# Patient Record
Sex: Female | Born: 1965 | Race: White | Hispanic: No | Marital: Married | Smoking: Former smoker
Health system: Southern US, Community
[De-identification: ages and names within clinical notes are randomized; demographics above are authoritative.]

## PROBLEM LIST (undated history)

## (undated) DIAGNOSIS — K8020 Calculus of gallbladder without cholecystitis without obstruction: Secondary | ICD-10-CM

## (undated) DIAGNOSIS — M8580 Other specified disorders of bone density and structure, unspecified site: Secondary | ICD-10-CM

## (undated) DIAGNOSIS — M810 Age-related osteoporosis without current pathological fracture: Secondary | ICD-10-CM

## (undated) DIAGNOSIS — F32A Depression, unspecified: Secondary | ICD-10-CM

## (undated) DIAGNOSIS — G5730 Lesion of lateral popliteal nerve, unspecified lower limb: Secondary | ICD-10-CM

## (undated) DIAGNOSIS — G8929 Other chronic pain: Secondary | ICD-10-CM

## (undated) DIAGNOSIS — F329 Major depressive disorder, single episode, unspecified: Secondary | ICD-10-CM

## (undated) DIAGNOSIS — N200 Calculus of kidney: Secondary | ICD-10-CM

## (undated) DIAGNOSIS — F419 Anxiety disorder, unspecified: Secondary | ICD-10-CM

## (undated) HISTORY — DX: Calculus of gallbladder without cholecystitis without obstruction: K80.20

## (undated) HISTORY — DX: Anxiety disorder, unspecified: F41.9

## (undated) HISTORY — DX: Other specified disorders of bone density and structure, unspecified site: M85.80

## (undated) HISTORY — DX: Other chronic pain: G89.29

## (undated) HISTORY — PX: CHOLECYSTECTOMY: SHX55

## (undated) HISTORY — PX: LITHOTRIPSY: SUR834

## (undated) HISTORY — DX: Age-related osteoporosis without current pathological fracture: M81.0

## (undated) HISTORY — DX: Lesion of lateral popliteal nerve, unspecified lower limb: G57.30

## (undated) HISTORY — DX: Calculus of kidney: N20.0

## (undated) HISTORY — DX: Depression, unspecified: F32.A

---
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
## 1997-07-23 ENCOUNTER — Ambulatory Visit (HOSPITAL_COMMUNITY): Admission: RE | Admit: 1997-07-23 | Discharge: 1997-07-23 | Payer: Self-pay | Admitting: Obstetrics and Gynecology

## 1997-10-29 ENCOUNTER — Other Ambulatory Visit: Admission: RE | Admit: 1997-10-29 | Discharge: 1997-10-29 | Payer: Self-pay | Admitting: Obstetrics and Gynecology

## 1997-12-01 ENCOUNTER — Encounter: Admission: RE | Admit: 1997-12-01 | Discharge: 1998-03-01 | Payer: Self-pay | Admitting: Obstetrics and Gynecology

## 1998-05-20 ENCOUNTER — Inpatient Hospital Stay (HOSPITAL_COMMUNITY): Admission: AD | Admit: 1998-05-20 | Discharge: 1998-05-20 | Payer: Self-pay | Admitting: Obstetrics and Gynecology

## 1998-05-21 ENCOUNTER — Inpatient Hospital Stay (HOSPITAL_COMMUNITY): Admission: AD | Admit: 1998-05-21 | Discharge: 1998-05-23 | Payer: Self-pay | Admitting: Obstetrics and Gynecology

## 1998-06-17 ENCOUNTER — Other Ambulatory Visit: Admission: RE | Admit: 1998-06-17 | Discharge: 1998-06-17 | Payer: Self-pay | Admitting: Obstetrics and Gynecology

## 1999-07-06 ENCOUNTER — Other Ambulatory Visit: Admission: RE | Admit: 1999-07-06 | Discharge: 1999-07-06 | Payer: Self-pay | Admitting: Obstetrics and Gynecology

## 1999-08-08 ENCOUNTER — Encounter: Payer: Self-pay | Admitting: Internal Medicine

## 1999-08-08 ENCOUNTER — Inpatient Hospital Stay (HOSPITAL_COMMUNITY): Admission: AD | Admit: 1999-08-08 | Discharge: 1999-08-15 | Payer: Self-pay | Admitting: Internal Medicine

## 1999-08-12 ENCOUNTER — Encounter: Payer: Self-pay | Admitting: Internal Medicine

## 1999-08-13 ENCOUNTER — Encounter: Payer: Self-pay | Admitting: Surgery

## 2000-09-10 ENCOUNTER — Other Ambulatory Visit: Admission: RE | Admit: 2000-09-10 | Discharge: 2000-09-10 | Payer: Self-pay | Admitting: Obstetrics and Gynecology

## 2001-09-18 ENCOUNTER — Other Ambulatory Visit: Admission: RE | Admit: 2001-09-18 | Discharge: 2001-09-18 | Payer: Self-pay | Admitting: Obstetrics and Gynecology

## 2002-09-23 ENCOUNTER — Other Ambulatory Visit: Admission: RE | Admit: 2002-09-23 | Discharge: 2002-09-23 | Payer: Self-pay | Admitting: Obstetrics and Gynecology

## 2004-02-23 ENCOUNTER — Other Ambulatory Visit: Admission: RE | Admit: 2004-02-23 | Discharge: 2004-02-23 | Payer: Self-pay | Admitting: Obstetrics and Gynecology

## 2010-11-11 ENCOUNTER — Emergency Department (HOSPITAL_COMMUNITY): Payer: No Typology Code available for payment source

## 2010-11-11 ENCOUNTER — Emergency Department (HOSPITAL_COMMUNITY)
Admission: EM | Admit: 2010-11-11 | Discharge: 2010-11-11 | Disposition: A | Discharge status: Home / Self Care | Payer: No Typology Code available for payment source | Attending: Emergency Medicine | Admitting: Emergency Medicine

## 2010-11-11 DIAGNOSIS — R42 Dizziness and giddiness: Secondary | ICD-10-CM | POA: Insufficient documentation

## 2010-11-11 DIAGNOSIS — Z79899 Other long term (current) drug therapy: Secondary | ICD-10-CM | POA: Insufficient documentation

## 2010-11-11 DIAGNOSIS — N201 Calculus of ureter: Secondary | ICD-10-CM | POA: Insufficient documentation

## 2010-11-11 DIAGNOSIS — N1330 Unspecified hydronephrosis: Secondary | ICD-10-CM | POA: Insufficient documentation

## 2010-11-11 LAB — CBC
Platelets: 216 10*3/uL (ref 150–400)
RBC: 4.46 MIL/uL (ref 3.87–5.11)
WBC: 20.2 10*3/uL — ABNORMAL HIGH (ref 4.0–10.5)

## 2010-11-11 LAB — URINALYSIS, ROUTINE W REFLEX MICROSCOPIC
Bilirubin Urine: NEGATIVE
Glucose, UA: NEGATIVE mg/dL
Leukocytes, UA: NEGATIVE
Nitrite: NEGATIVE
Protein, ur: NEGATIVE mg/dL
Specific Gravity, Urine: 1.024 (ref 1.005–1.030)
Urobilinogen, UA: 1 mg/dL (ref 0.0–1.0)
pH: 7.5 (ref 5.0–8.0)

## 2010-11-11 LAB — BASIC METABOLIC PANEL
CO2: 23 mEq/L (ref 19–32)
Chloride: 100 mEq/L (ref 96–112)
Sodium: 135 mEq/L (ref 135–145)

## 2010-11-11 LAB — DIFFERENTIAL
Basophils Relative: 0 % (ref 0–1)
Eosinophils Absolute: 0.1 10*3/uL (ref 0.0–0.7)
Lymphs Abs: 2.3 10*3/uL (ref 0.7–4.0)
Neutro Abs: 16.3 10*3/uL — ABNORMAL HIGH (ref 1.7–7.7)
Neutrophils Relative %: 81 % — ABNORMAL HIGH (ref 43–77)

## 2010-11-11 LAB — PREGNANCY, URINE: Preg Test, Ur: NEGATIVE

## 2010-11-11 LAB — URINE MICROSCOPIC-ADD ON

## 2010-11-13 LAB — URINE CULTURE
Colony Count: 25000
Culture  Setup Time: 201210280203

## 2010-12-13 ENCOUNTER — Other Ambulatory Visit: Payer: Self-pay | Admitting: Obstetrics and Gynecology

## 2010-12-13 DIAGNOSIS — N63 Unspecified lump in unspecified breast: Secondary | ICD-10-CM

## 2010-12-27 ENCOUNTER — Ambulatory Visit
Admission: RE | Admit: 2010-12-27 | Discharge: 2010-12-27 | Disposition: A | Discharge status: Home / Self Care | Payer: No Typology Code available for payment source | Source: Ambulatory Visit | Attending: Obstetrics and Gynecology | Admitting: Obstetrics and Gynecology

## 2010-12-27 DIAGNOSIS — N63 Unspecified lump in unspecified breast: Secondary | ICD-10-CM

## 2012-01-23 IMAGING — MG MM DIGITAL DIAGNOSTIC BILAT
5 series · 5 of 5 positions shown · non-contrast
Comparison: 12/01/2008 and 04/02/2007 mammograms

CLINICAL DATA: 45-year-old female with palpable thickening and
tenderness in the outer left breast.

DIGITAL DIAGNOSTIC BILATERAL MAMMOGRAM  with CAD AND LEFT BREAST
ULTRASOUND

[R CC]
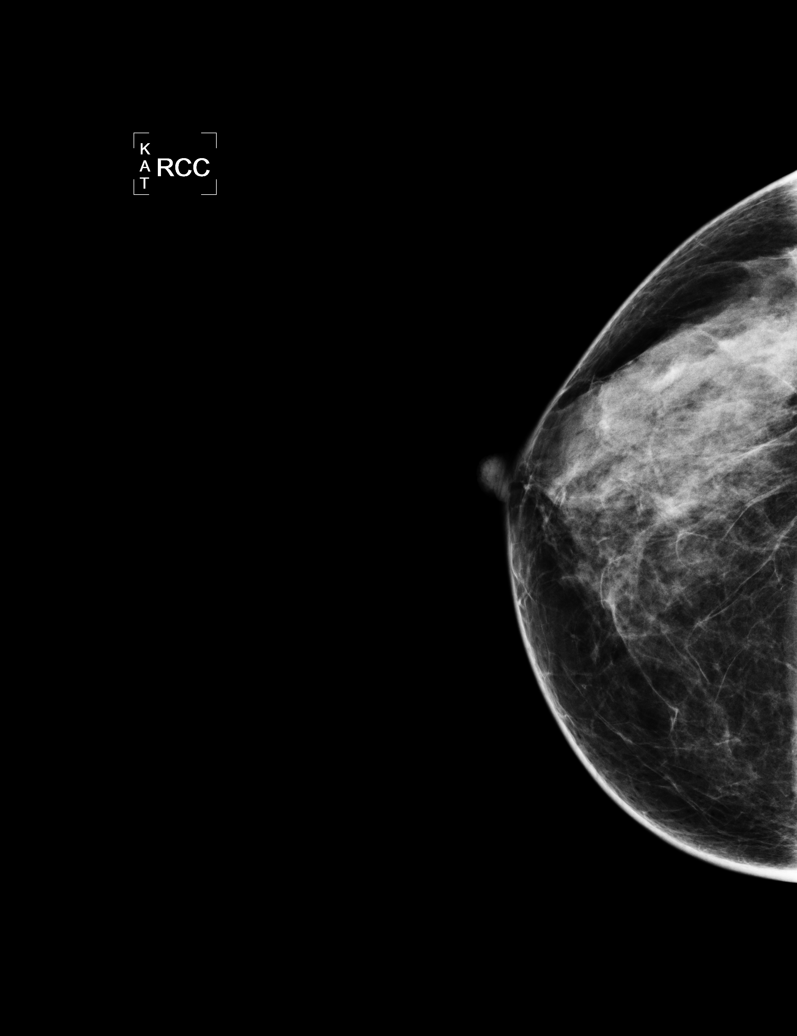

[L CC]
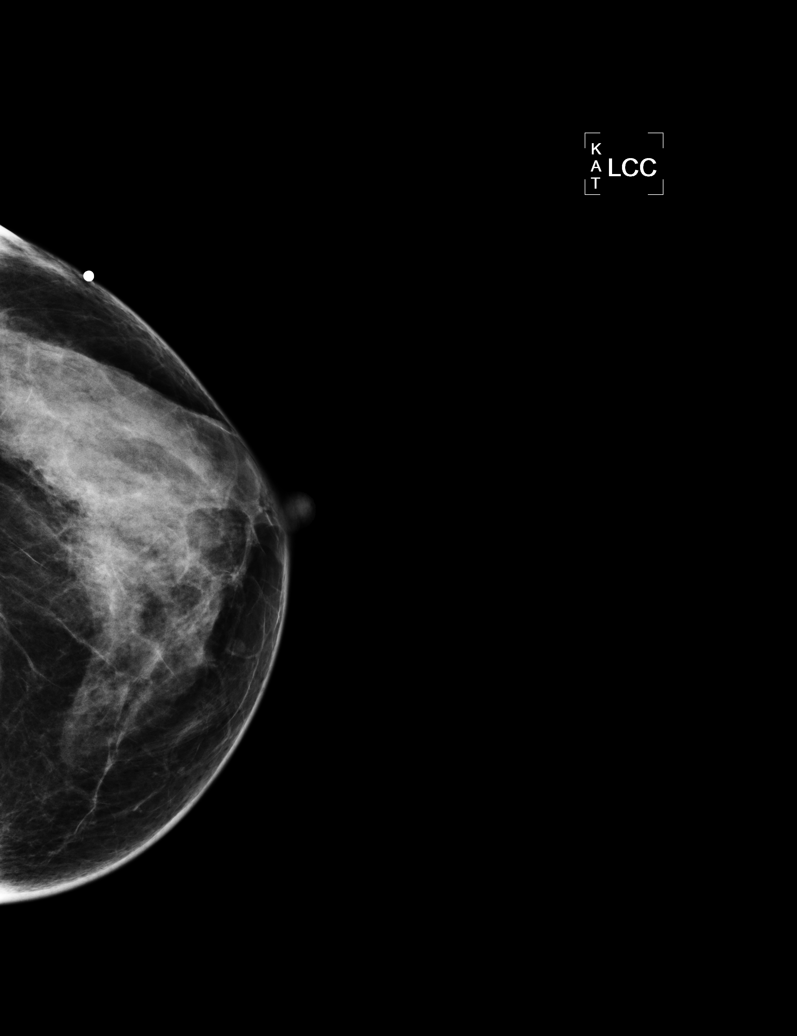

[L MLO]
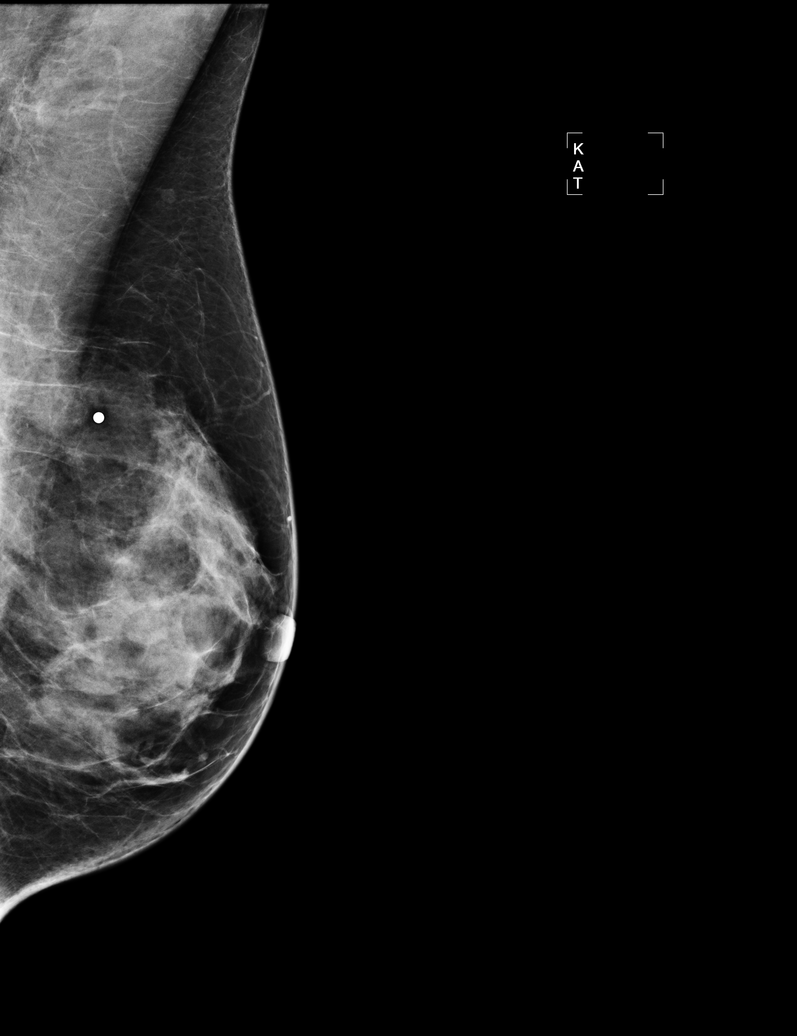

[R MLO]
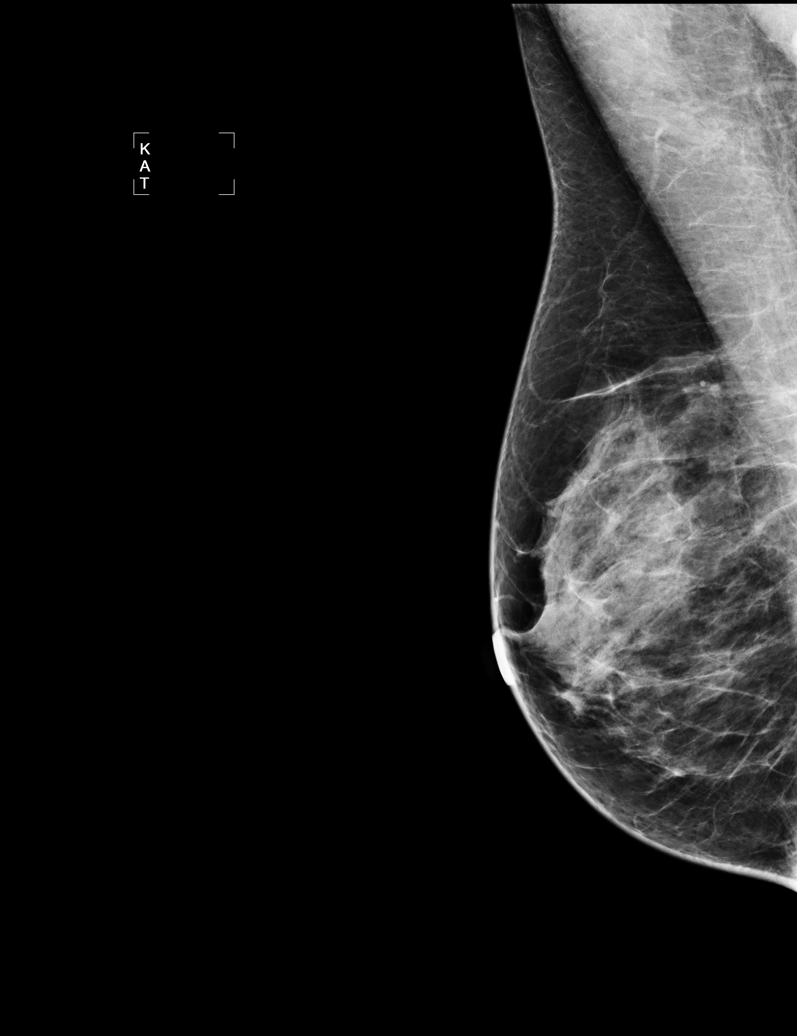

[L TAN]
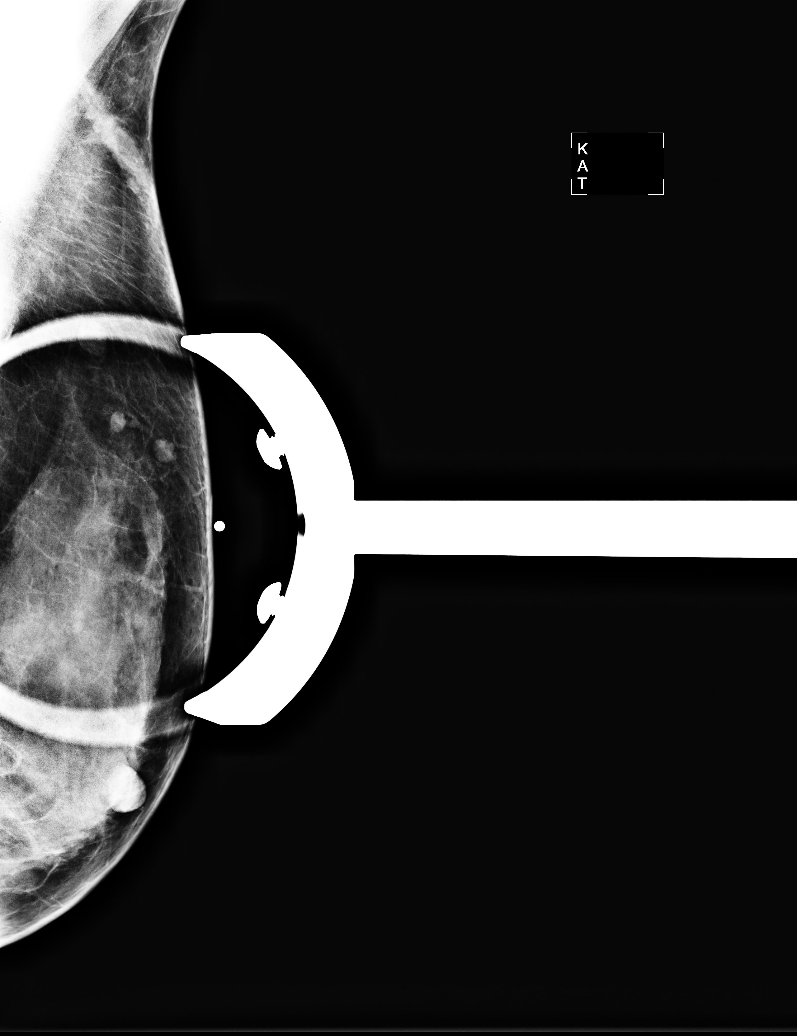

[5 of 5 positions shown; findings below may reference images not displayed]

FINDINGS: A spot compression view of the left breast and routine
views of bilateral breasts demonstrate no evidence of mass,
distortion, or suspicious calcifications.
Heterogeneously dense breast tissue bilaterally is again
identified.
Mammographic images were processed with CAD.

On physical exam, no palpable abnormalities are identified in the
outer left breast.

Targeted ultrasound of the outer left breast demonstrates no
evidence of solid or cystic mass, distortion or abnormal areas of
shadowing.
IMPRESSION: No mammographic, palpable or sonographic abnormalities in the outer
left breast in the area of palpable concern.

No specific mammographic evidence of breast malignancy.

These findings were discussed with the patient.  She was encouraged
to begin/continue monthly self exams and to contact her primary
physician if any changes noted.

BI-RADS CATEGORY 1:  Negative.

Recommend bilateral screening mammograms in 1 year.

## 2013-01-15 HISTORY — PX: SHOULDER ARTHROSCOPY: SHX128

## 2013-06-30 ENCOUNTER — Other Ambulatory Visit: Payer: Self-pay | Admitting: Obstetrics and Gynecology

## 2013-06-30 DIAGNOSIS — R922 Inconclusive mammogram: Secondary | ICD-10-CM

## 2013-07-08 ENCOUNTER — Ambulatory Visit
Admission: RE | Admit: 2013-07-08 | Discharge: 2013-07-08 | Disposition: A | Discharge status: Home / Self Care | Payer: BC Managed Care – PPO | Source: Ambulatory Visit | Attending: Obstetrics and Gynecology | Admitting: Obstetrics and Gynecology

## 2013-07-08 DIAGNOSIS — R922 Inconclusive mammogram: Secondary | ICD-10-CM

## 2013-07-08 DIAGNOSIS — R9230 Dense breasts, unspecified: Secondary | ICD-10-CM

## 2013-07-08 MED ORDER — GADOBENATE DIMEGLUMINE 529 MG/ML IV SOLN
13.0000 mL | Freq: Once | INTRAVENOUS | Status: AC | PRN
  Administered 2013-07-08: 13 mL via INTRAVENOUS

## 2013-07-09 ENCOUNTER — Other Ambulatory Visit: Payer: Self-pay | Admitting: Obstetrics and Gynecology

## 2013-07-09 DIAGNOSIS — R928 Other abnormal and inconclusive findings on diagnostic imaging of breast: Secondary | ICD-10-CM

## 2013-07-22 ENCOUNTER — Ambulatory Visit
Admission: RE | Admit: 2013-07-22 | Discharge: 2013-07-22 | Disposition: A | Discharge status: Home / Self Care | Payer: BC Managed Care – PPO | Source: Ambulatory Visit | Attending: Obstetrics and Gynecology | Admitting: Obstetrics and Gynecology

## 2013-07-22 ENCOUNTER — Ambulatory Visit: Payer: BC Managed Care – PPO

## 2013-07-22 ENCOUNTER — Other Ambulatory Visit: Payer: Self-pay | Admitting: Obstetrics and Gynecology

## 2013-07-22 DIAGNOSIS — R928 Other abnormal and inconclusive findings on diagnostic imaging of breast: Secondary | ICD-10-CM

## 2013-07-29 ENCOUNTER — Ambulatory Visit
Admission: RE | Admit: 2013-07-29 | Discharge: 2013-07-29 | Disposition: A | Discharge status: Home / Self Care | Payer: BC Managed Care – PPO | Source: Ambulatory Visit | Attending: Obstetrics and Gynecology | Admitting: Obstetrics and Gynecology

## 2013-07-29 DIAGNOSIS — R928 Other abnormal and inconclusive findings on diagnostic imaging of breast: Secondary | ICD-10-CM

## 2013-07-29 MED ORDER — GADOBENATE DIMEGLUMINE 529 MG/ML IV SOLN
13.0000 mL | Freq: Once | INTRAVENOUS | Status: AC | PRN
  Administered 2013-07-29: 13 mL via INTRAVENOUS

## 2014-01-12 ENCOUNTER — Other Ambulatory Visit: Payer: Self-pay | Admitting: Obstetrics and Gynecology

## 2014-01-12 DIAGNOSIS — R922 Inconclusive mammogram: Secondary | ICD-10-CM

## 2014-01-21 ENCOUNTER — Other Ambulatory Visit: Payer: BC Managed Care – PPO

## 2014-03-10 ENCOUNTER — Other Ambulatory Visit (HOSPITAL_COMMUNITY): Payer: Self-pay | Admitting: Obstetrics and Gynecology

## 2014-03-10 DIAGNOSIS — Z09 Encounter for follow-up examination after completed treatment for conditions other than malignant neoplasm: Secondary | ICD-10-CM

## 2014-03-11 ENCOUNTER — Other Ambulatory Visit (HOSPITAL_COMMUNITY): Payer: Self-pay | Admitting: Obstetrics and Gynecology

## 2014-03-11 ENCOUNTER — Other Ambulatory Visit: Payer: Self-pay

## 2014-03-11 DIAGNOSIS — R922 Inconclusive mammogram: Secondary | ICD-10-CM

## 2014-03-24 ENCOUNTER — Ambulatory Visit (HOSPITAL_COMMUNITY): Payer: Self-pay

## 2014-03-25 ENCOUNTER — Other Ambulatory Visit: Payer: Self-pay | Admitting: Obstetrics and Gynecology

## 2014-03-26 ENCOUNTER — Other Ambulatory Visit: Payer: Self-pay | Admitting: Obstetrics and Gynecology

## 2014-03-26 DIAGNOSIS — N6320 Unspecified lump in the left breast, unspecified quadrant: Secondary | ICD-10-CM

## 2014-03-26 LAB — CYTOLOGY - PAP

## 2015-11-04 DIAGNOSIS — R3129 Other microscopic hematuria: Secondary | ICD-10-CM | POA: Insufficient documentation

## 2016-11-07 ENCOUNTER — Encounter: Payer: Self-pay | Admitting: Internal Medicine

## 2017-10-16 DIAGNOSIS — M545 Low back pain, unspecified: Secondary | ICD-10-CM | POA: Insufficient documentation

## 2017-11-26 DIAGNOSIS — M549 Dorsalgia, unspecified: Secondary | ICD-10-CM | POA: Insufficient documentation

## 2017-11-26 DIAGNOSIS — G8929 Other chronic pain: Secondary | ICD-10-CM | POA: Insufficient documentation

## 2018-02-06 ENCOUNTER — Ambulatory Visit: Payer: Self-pay | Admitting: Neurology

## 2018-11-15 ENCOUNTER — Ambulatory Visit (HOSPITAL_COMMUNITY)
Admission: EM | Admit: 2018-11-15 | Discharge: 2018-11-15 | Disposition: A | Discharge status: Home / Self Care | Payer: No Typology Code available for payment source

## 2018-11-15 NOTE — ED Triage Notes (Signed)
Pt states she is having back pain on and off x 20 years. Pt states at the beginning of October she started having numbness in her legs. Pt states in the last week she stopped feeling any sensation from the knee down.  Pt states she had MRI 10/23/2017 Pt has an appointment with Neurologist on Nov 26, 2018

## 2018-11-26 ENCOUNTER — Other Ambulatory Visit: Payer: Self-pay

## 2018-11-26 ENCOUNTER — Ambulatory Visit (INDEPENDENT_AMBULATORY_CARE_PROVIDER_SITE_OTHER): Payer: Self-pay | Admitting: Neurology

## 2018-11-26 ENCOUNTER — Encounter: Payer: Self-pay | Admitting: Neurology

## 2018-11-26 VITALS — BP 107/74 | HR 78 | Temp 97.7°F | Ht 66.0 in | Wt 152.0 lb

## 2018-11-26 DIAGNOSIS — M6281 Muscle weakness (generalized): Secondary | ICD-10-CM

## 2018-11-26 DIAGNOSIS — R2990 Unspecified symptoms and signs involving the nervous system: Secondary | ICD-10-CM

## 2018-11-26 DIAGNOSIS — R202 Paresthesia of skin: Secondary | ICD-10-CM

## 2018-11-26 DIAGNOSIS — R531 Weakness: Secondary | ICD-10-CM

## 2018-11-26 DIAGNOSIS — R200 Anesthesia of skin: Secondary | ICD-10-CM

## 2018-11-26 DIAGNOSIS — R29898 Other symptoms and signs involving the musculoskeletal system: Secondary | ICD-10-CM

## 2018-11-26 DIAGNOSIS — R292 Abnormal reflex: Secondary | ICD-10-CM

## 2018-11-26 NOTE — Patient Instructions (Signed)
MRI of the brain and cervical spine Blood work (will ask Dr. Philip Aspen for his labs and review prior) Emg/ncs   Electromyoneurogram Electromyoneurogram is a test to check how well your muscles and nerves are working. This procedure includes the combined use of electromyogram (EMG) and nerve conduction study (NCS). EMG is used to look for muscular disorders. NCS, which is also called electroneurogram, measures how well your nerves are controlling your muscles. The procedures are usually done together to check if your muscles and nerves are healthy. If the results of the tests are abnormal, this may indicate disease or injury, such as a neuromuscular disease or peripheral nerve damage. Tell a health care provider about:  Any allergies you have.  All medicines you are taking, including vitamins, herbs, eye drops, creams, and over-the-counter medicines.  Any problems you or family members have had with anesthetic medicines.  Any blood disorders you have.  Any surgeries you have had.  Any medical conditions you have.  If you have a pacemaker.  Whether you are pregnant or may be pregnant. What are the risks? Generally, this is a safe procedure. However, problems may occur, including:  Infection where the electrodes were inserted.  Bleeding. What happens before the procedure? Medicines Ask your health care provider about:  Changing or stopping your regular medicines. This is especially important if you are taking diabetes medicines or blood thinners.  Taking medicines such as aspirin and ibuprofen. These medicines can thin your blood. Do not take these medicines unless your health care provider tells you to take them.  Taking over-the-counter medicines, vitamins, herbs, and supplements. General instructions  Your health care provider may ask you to avoid: ? Beverages that have caffeine, such as coffee and tea. ? Any products that contain nicotine or tobacco. These products include  cigarettes, e-cigarettes, and chewing tobacco. If you need help quitting, ask your health care provider.  Do not use lotions or creams on the same day that you will be having the procedure. What happens during the procedure? For EMG   Your health care provider will ask you to stay in a position so that he or she can access the muscle that will be studied. You may be standing, sitting, or lying down.  You may be given a medicine that numbs the area (local anesthetic).  A very thin needle that has an electrode will be inserted into your muscle.  Another small electrode will be placed on your skin near the muscle.  Your health care provider will ask you to continue to remain still.  The electrodes will send a signal that tells about the electrical activity of your muscles. You may see this on a monitor or hear it in the room.  After your muscles have been studied at rest, your health care provider will ask you to contract or flex your muscles. The electrodes will send a signal that tells about the electrical activity of your muscles.  Your health care provider will remove the electrodes and the electrode needles when the procedure is finished. The procedure may vary among health care providers and hospitals. For NCS   An electrode that records your nerve activity (recording electrode) will be placed on your skin by the muscle that is being studied.  An electrode that is used as a reference (reference electrode) will be placed near the recording electrode.  A paste or gel will be applied to your skin between the recording electrode and the reference electrode.  Your nerve will  be stimulated with a mild shock. Your health care provider will measure how much time it takes for your muscle to react.  Your health care provider will remove the electrodes and the gel when the procedure is finished. The procedure may vary among health care providers and hospitals. What happens after the  procedure?  It is up to you to get the results of your procedure. Ask your health care provider, or the department that is doing the procedure, when your results will be ready.  Your health care provider may: ? Give you medicines for any pain. ? Monitor the insertion sites to make sure that bleeding stops. Summary  Electromyoneurogram is a test to check how well your muscles and nerves are working.  If the results of the tests are abnormal, this may indicate disease or injury.  This is a safe procedure. However, problems may occur, such as bleeding and infection.  Your health care provider will do two tests to complete this procedure. One checks your muscles (EMG) and another checks your nerves (NCS).  It is up to you to get the results of your procedure. Ask your health care provider, or the department that is doing the procedure, when your results will be ready. This information is not intended to replace advice given to you by your health care provider. Make sure you discuss any questions you have with your health care provider. Document Released: 05/04/2004 Document Revised: 09/17/2017 Document Reviewed: 08/30/2017 Elsevier Patient Education  2020 [employer].

## 2018-11-26 NOTE — Progress Notes (Signed)
[facility]    Provider:  Dr Jaynee Eagles Requesting Provider: Suella Broad, MD Primary Care Provider:  Leanna Battles, MD  CC:  Leg numbness  HPI:  Sheena Gray is a 53 y.o. female here as requested by Suella Broad, MD for bilateral leg numbness. PMHx chronic low back pain, kidney stones.  I reviewed Dr. Herma Mering notes: Patient is on amitriptyline, methocarbamol, prednisone and sertraline, she has pain in the lumbar spine, chronic back pain, never smoker.  Patient was seen for chronic low back pain, I reviewed procedure note which shows that a spinal needle was placed in the L4-L5 interspace in the midline using fluoroscopic imaging, the epidural space was localized, 1 mL of Omnipaque was injected, there was good epidural flow, injected 1 mL of dexamethasone, patient tolerated the procedure very well.  The last labs I can find are from 2017, creatinine was 0.9 at that time.  I do not have her primary care records since she was referred from Dr. Nelva Bush.Will request. She saw Dr. [name] in 1998. She says she is in so much pain she cannot function.   Symptoms ongoing since 1998. Started in the toes and now progressive. She has chronic low back pain. She has herniated disks and disk degeneration, Dr. Rolena Infante does not think she is surgical, she sees Dr. Nelva Bush for a pain block. There is pain in the low back and into the buttocks. New symptoms includes sensory changes up to the thighs. Up above her knees and the pain is in the front of the shins, muscles in her calfs are in spasm, tight sore muscles, numb and tingly up the front of the leg like shint splints and the gastroc muscle is tight within the last month. Also pain in the inner thigh on the left leg tingly. She also reports numbness through both hands and her entire mouth went numb. She was told she may have MS. She is a Scientist, forensic. She is on her feet and it is intense. She is in tears every night due to the pain. Lower  back pain doesn't go away. Constant, continuous. She has gone to PT and seen multiple doctors and neurology.   Reviewed notes, labs and imaging from outside physicians, which showed:  I reviewed imaging that patient brought on CD is for me from [facility] orthopedics: MRI of the lumbar spine completed in April of 2015 showed some small left foraminal disc protrusions at L3-L4 and L4-L5 which contact the corresponding dorsal root ganglia (L3 and L4) but no neural displacement or encroachment, L5-S1 mild bilateral facet arthrosis no neural displacement or encroachment.  This was basically stable from an exam they had done the prior month.  Study in April 15 was stable from the previous study in March 2015.  MRI dated October 23, 2017 showed very similar findings to prior exams, L3 and L4 nerve roots minimally contact by very small foraminal protrusions at L3-L4 and L4-L5, slight disc bulge and mild facet degenerative joint disease at L5-S1 without stenosis or nerve impingement.  I also reviewed other notes that patient brought me an extensive list and I have documented only the relevant portions: Patient has been on methocarbamol in the past, methylprednisone Dosepak, sertraline, it appears she is also had pain blocks with Dr. Herma Mering Jun 08, 2018.  She has had methylprednisolone injections and Toradol at [facility] September 2019.  She is seen Melina Schools, I notes he had date on this office note however she reported having pain  level 7 out of 10, complaining of pain tingling and numbness midline and left lower back, left buttocks and down the leg to the toes as well as the right foot, physical therapy including outpatient physical therapy did not help, patient taking Aleve Advil and methocarbamol, Dr. Melina Schools did not feel that there was anything from a surgical standpoint that he could offer her for her lumbar spine and there is no objective clinical findings or evidence of structural abnormality, and  she was transferred care to Dr. Herma Mering for back pain management.  It appears that patient was seen in November 2019 by Dr. Herma Mering as well as Dr. Melina Schools.  I reviewed Dr. Herma Mering note history of present illness from November 28, 2017, midline low back pain, left buttocks pain, radiating down to the leg to the foot with numbness and tingling, also right lower leg from the knee down going on for approximately 2 months, back in situ since 1998, she is tried acupuncture, chiropractic care, injections in May 2015 with Dr. Herma Mering which she said caused more pain.  She had PT.  Robaxin and sertraline.  Saw Dr. Rolena Infante and neurosurgery recommended.  Dr. Dossie Der had done an L4 selective nerve root block and it actually just made her worse, fortunately she did get better with time, but unfortunately she is having recurrent symptoms, pain in her back radiating into the left lower limb for about 2 months, now even feeling for some foot numbness and tingling on the right radiating proximally up toward her right knee, not doing any better whatsoever.  He thought that the MRI in October 2019 actually looks a little better compared to the MRI done in 2015.  She had a reaction to gabapentin, she was sent to neurology, amitriptyline tried in the past, Dr. Rolena Infante not recommending surgery, they did discuss the left SI joint injection.  She cannot take gabapentin due to her reaction in the past, also prior notes from [place] regarding her shoulder status post arthroscopy April 13, 2013.  I see notes also from 2014 similar left shoulder pain at that time she was on tramadol, Robaxin, Zoloft.  And around that time also Modic.  Patient also provided me with notes from [facility] back in February 1999 by Dr. Stevie Kern for chronic low back pain, shoulder and hip pain quite incapacitating, evaluated by Dr. Sharol Given normal x-rays were told she had chronic low back pain, started on Zoloft, reported no relief,  bone scan was entirely normal of the lumbosacral spine and pelvis, a great deal of stress, at the time noted she was quite overweight had lost 35 to 40 pounds very normal stature presently, poor posture, no focal neurologic deficits, diagnosed with musculoskeletal pain no evidence for myelopathy or radiculopathy.  She was referred to [facility] for acupuncture and holistic rehabilitative approach to her problem.  Unfortunately after seeing Dr. Gust Rung she developed numbness and tingling down her left leg, at that time after he saw her she developed paralysis of her left leg, she was transferred to [facility] emergency room and had an evaluation there by a neurologist which included MRI which was normal, her exam was normal, and the paralysis resolved, but she was still reporting persistent numbness in her left leg, back pain, numbness going from her low back to her lateral thigh all the way to her foot, feeling of her leg being asleep, at times it feels so numb it has been as if she is gotten a  shot of Novocain, she also reported MRI as above and a bone scan and a trial of antidepressants without any relief, physical therapy at the time suggested piriformis syndrome and with a course of physical therapy, water exercise and stretching the physical therapist suggested her pain was somewhat better although the numbness and tingling still persist.  She provided me notes that go back to September 1999 where she reported back pain, she just found out she was pregnant, back stiffness first thing in the morning, improving with activity, feels the physical therapy is helping, nearly constant tingling sensation in her left buttocks and thigh and the whole area, feels numb, extensive evaluation in the past including an MRI which was unremarkable.  She is also seen Dr. [name] in the past who felt that there was some functional overlay and without any objective abnormalities.  Patient also saw Dr. Morrell Riddle who referred her to Dr.  Sigurd Sos for acupuncture.  Normal c-Met, TSH, CBC and sed rate in the office.  I reviewed further notes going back even further without any significantly new findings. .  Review of Systems: Patient complains of symptoms per HPI as well as the following symptoms: pain, low back pain, numbness in jaw and both hands Pertinent negatives and positives per HPI. All others negative.   Social History   Socioeconomic History   Marital status: Married    Spouse name: Not on file   Number of children: 2   Years of education: post-graduate   Highest education level: Not on file  Occupational History   Not on file  Social Needs   Financial resource strain: Not on file   Food insecurity    Worry: Not on file    Inability: Not on file   Transportation needs    Medical: Not on file    Non-medical: Not on file  Tobacco Use   Smoking status: Never Smoker  Substance and Sexual Activity   Alcohol use: Not on file    Comment: no   Drug use: Not on file    Comment: no   Sexual activity: Not on file  Lifestyle   Physical activity    Days per week: Not on file    Minutes per session: Not on file   Stress: Not on file  Relationships   Social connections    Talks on phone: Not on file    Gets together: Not on file    Attends religious service: Not on file    Active member of club or organization: Not on file    Attends meetings of clubs or organizations: Not on file    Relationship status: Not on file   Intimate partner violence    Fear of current or ex partner: Not on file    Emotionally abused: Not on file    Physically abused: Not on file    Forced sexual activity: Not on file  Other Topics Concern   Not on file  Social History Narrative   Lives at home with spouse    Family History  Problem Relation Age of Onset   Breast cancer Mother    Osteoporosis Mother    Asthma Mother    Heart disease Father    Heart attack Father    Diabetes Other     Past  Medical History:  Diagnosis Date   Kidney stones     Patient Active Problem List   Diagnosis Date Noted   Multiple neurological symptoms 12/01/2018    Past  Surgical History:  Procedure Laterality Date   CHOLECYSTECTOMY     SHOULDER ARTHROSCOPY  2015    Current Outpatient Medications  Medication Sig Dispense Refill   amitriptyline (ELAVIL) 10 MG tablet Take 10 mg by mouth at bedtime.     Melatonin 5 MG TABS Take by mouth at bedtime as needed.     methocarbamol (ROBAXIN) 500 MG tablet Take 500 mg by mouth at bedtime as needed.      Multiple Vitamins-Minerals (CENTRUM SILVER 50+WOMEN PO) Take 1 tablet by mouth daily.     OVER THE COUNTER MEDICATION Alive Calcium bone formula     sertraline (ZOLOFT) 100 MG tablet Take 100 mg by mouth daily.     ALPRAZolam (XANAX) 0.5 MG tablet Take 0.5 mg by mouth as needed.     No current facility-administered medications for this visit.     Allergies as of 11/26/2018 - Review Complete 11/26/2018  Allergen Reaction Noted   Shellfish allergy Nausea And Vomiting 11/26/2018    Vitals: BP 107/74 (BP Location: Right Arm, Patient Position: Sitting)    Pulse 78    Temp 97.7 F (36.5 C) Comment: taken at front door   Ht 5' 6" (1.676 m)    Wt 152 lb (68.9 kg)    BMI 24.53 kg/m  Last Weight:  Wt Readings from Last 1 Encounters:  11/26/18 152 lb (68.9 kg)   Last Height:   Ht Readings from Last 1 Encounters:  11/26/18 5' 6" (1.676 m)     Physical exam: Exam: Gen: NAD, conversant, well nourised, well groomed                     CV: RRR, no MRG. No Carotid Bruits. No peripheral edema, warm, nontender Eyes: Conjunctivae clear without exudates or hemorrhage  Neuro: Detailed Neurologic Exam  Speech:    Speech is normal; fluent and spontaneous with normal comprehension.  Cognition:    The patient is oriented to person, place, and time;     recent and remote memory intact;     language fluent;     normal attention, concentration,       fund of knowledge Cranial Nerves:    The pupils are equal, round, and reactive to light. The fundi are flat. Visual fields are full to finger confrontation. Extraocular movements are intact. Trigeminal sensation is intact and the muscles of mastication are normal. Right lid retracted slightly. face is symmetric. The palate elevates in the midline. Hearing intact. Voice is normal. Shoulder shrug is normal. The tongue has normal motion without fasciculations.   Coordination:    Normal finger to nose and heel to shin. Normal rapid alternating movements.   Gait:    Heel-toe and tandem gait are normal.   Motor Observation:    No asymmetry, no atrophy, and no involuntary movements noted. Tone:    Normal muscle tone.    Posture:    Posture is normal. normal erect    Strength: Left hip flexion weakness 4+/5.  Otherwise strength is V/V in the upper and lower limbs.      Sensation: intact to LT, intact pin prick distally in the feet ad  leg possibly some dysesthesias laterally at knees, intact vibration     Reflex Exam:  DTR's: was able to elicit good AJ reflexes with dendritic maneuver however right AJ slighlty diminished. Otherwise brisk refexes.    Toes:    The toes are downgoing bilaterally.   Clonus:    Clonus is  absent.    Assessment/Plan: This is a very nice 53 year old female with a past medical history of chronic back pain, radicular symptoms, paresthesias, weakness in the lower extremities that started in 1998 without etiology found, she has been to multiple physicians, surgeons, physical therapists, had acupuncture and chiropractic, been to orthopedists and pain specialist and had multiple interventions and medications without significant relief.  She also reports numbness in her face, and both hands, sensory changes up to above the knees, sore muscles and spasms, numbness both hands and in the face specifically the whole mouth.  Despite all her efforts no etiology has been  found.  She seen neurology in the past who suggested there may be some functional overlay.  As far as I can see, patient has never been evaluated for disorder such as multiple sclerosis that could cause multiple neurologic symptoms I do recommend in addition to all her extensive work-ups in the past that she have an MRI of the brain and the cervical spine for any central causes such as multiple sclerosis.  I also recommend an EMG nerve conduction study to evaluate for any peripheral nerve disorders or muscle diseases.  -MRI of the brain/cervical spine due to numbness in the lower extremities, hands and face need to evaluate for multiple sclerosis given multiple unexplained neurologic symptoms - emg/ncs of upper extremities and one leg - Dr. Parke Simmers (need to get a copy of the labs and his records): I made a request from Dr. Sharlett Iles for his notes which included negative binding, blocking and modulating antibodies for myasthenia gravis, normal CRP, normal CK at 168, normal magnesium. - self pay, we discussed [facility] Financial Assistance and gave information  Orders Placed This Encounter  Procedures   MR BRAIN W WO CONTRAST   MR CERVICAL SPINE W WO CONTRAST   NCV with EMG(electromyography)     Cc: Suella Broad, MD,  Leanna Battles, MD  Sarina Ill, MD  [facility] [address] [place], [place] [address]  Phone [phone] Fax [phone]

## 2018-11-27 ENCOUNTER — Telehealth: Payer: Self-pay | Admitting: Neurology

## 2018-11-27 NOTE — Telephone Encounter (Signed)
Pt called and stated that Dr. Philip Aspen just sent the Lab results and she is wanting to move forward to what the next step is. Please advise.

## 2018-11-30 NOTE — Telephone Encounter (Signed)
Sheena Gray, patient was applying for [facility] Financial assistance so I am confused, she did not want to order anything since she is self pay until she was able to secure some financial help. Did she get the assistance? Is she going to pay out of pocket for the imaging (very expensive)? She needs to give me time to review her records as well, they are extensive.  thanks

## 2018-12-01 ENCOUNTER — Telehealth: Payer: Self-pay | Admitting: Neurology

## 2018-12-01 DIAGNOSIS — R2990 Unspecified symptoms and signs involving the nervous system: Secondary | ICD-10-CM | POA: Insufficient documentation

## 2018-12-01 NOTE — Telephone Encounter (Signed)
Pt called wanting to know if her lab results are in and also would like to know what is the next step for her treatment. She states she is in a lot of pain. Please advise.

## 2018-12-01 NOTE — Telephone Encounter (Signed)
I returned the pt's call. She stated she applied for [facility] financial assistance and was told the turnaround may be 30 days. She stated she would call tomorrow to see if this can be expedited. She understands Dr. Jaynee Eagles reviewed her notes and has ordered MRI c-spine, brain, and EMG/NCV. She went ahead and scheduled the EMG/NCV for 01/01/2019 @ 7:45 AM. She understands the MRI referrals team will be working on any auth and then will contact her about scheduling. The pt is prepared to pay cash if needed d/t the amount of pain she is in and is hopeful she will hear back soon from financial assistance. She understands our office can setup a payment plan if it is completed here. Her questions were answered. The pt stated she received a 5 day supply of Tramadol from PCP and is unsure this will work based on the past. She said her other medications are not helping. She would like Dr. Cathren Laine opinion. Pt verbalized appreciation.

## 2018-12-01 NOTE — Telephone Encounter (Signed)
Message from [place] this morning:   Pt called wanting to know if her lab results are in and also would like to know what is the next step for her treatment. She states she is in a lot of pain. Please advise.

## 2018-12-01 NOTE — Telephone Encounter (Signed)
Copied pt's message into other phone note from 11/27/2018.

## 2018-12-02 ENCOUNTER — Telehealth: Payer: Self-pay | Admitting: *Deleted

## 2018-12-02 NOTE — Telephone Encounter (Signed)
Pt cd mailed out on 12/02/18 to pt home address.

## 2018-12-02 NOTE — Telephone Encounter (Signed)
I don't treat chronic pain I am so sorry, I recommend a pain clinic or going back to Dr. Nelva Bush. thanks

## 2018-12-03 NOTE — Telephone Encounter (Signed)
I called the pt back. When she calls, please let her know I sent a message to Dr. Jaynee Eagles asking what she thought about the medication. Let her know Dr. Jaynee Eagles is so sorry, but she does not treat chronic pain. She recommends a pain clinic or going back to Dr. Nelva Bush for that. Dr. Jaynee Eagles will diagnose. We will be on the lookout for the MRI results when they are completed, but she would not do pain management.

## 2018-12-03 NOTE — Telephone Encounter (Signed)
Pt called back and was informed. Pt says thank you very much.

## 2018-12-05 NOTE — Telephone Encounter (Signed)
Pt has called to check on the status of her MRI.  Pt was told once the MRI coordinator is available she will contact her to discuss scheduling, pt will wait to be contacted.  This is Pharmacist, hospital

## 2018-12-08 NOTE — Telephone Encounter (Signed)
When I spoke with the patient she stated that she was told she can get a self pay quote for her NVC and EMG. She would like a quote on this, please call and advise.

## 2018-12-08 NOTE — Telephone Encounter (Signed)
I called the patient to let her know we sent to GI. I gave her their phone number [phone]. DW

## 2018-12-23 ENCOUNTER — Telehealth: Payer: Self-pay | Admitting: Neurology

## 2018-12-23 NOTE — Telephone Encounter (Signed)
Pt has called to inform Dr Jaynee Eagles that she is having more tingling in her face.  Pt also wants Dr Jaynee Eagles to be aware that she spoken with accounting and was denied coverage on testing.

## 2018-12-23 NOTE — Telephone Encounter (Addendum)
Spoke with Dr. Jaynee Eagles, for worsening symptoms she advises pt go to the ER. MRI brain had previously been ordered.   I called the pt and she didn't answer. I LVM advising if she is having worsening symptoms we advise she go to the ER. Also asked what testing pt was referring to in her message. Asked for call back. Left office hours and number in message. Discussed with Dr. Jaynee Eagles that this message was fine.

## 2018-12-24 NOTE — Telephone Encounter (Signed)
I spoke with the patient. She was denied [employer] financial assistance. She has her MRIs on 12/15 and EMG/NCV on 12/17. She will proceed with those and will call GI to see about the cash price for the MRIs. Pt understands the EMG charges are pending the test and to what extent/how many limbs are tested. We would have to see what insurance would cover upon filing before she would know the portion she is responsible for. Pt wants the test to be done to the extent that the physician needs. We discussed the paraesthesia in her face. She said it has been numb in the past, went away, now it has returned. She was advised again for any new or worsening symptoms she we advise she go to the ER. I told her I had spoken with Dr. Jaynee Eagles and was told this and that the tests are pending and there is nothing else we could do at this time. Pt said she wasn't going to the ER in a pandemic. I tried to encourage her not to be afraid to go in the event she needs emergency care as precautions are put in place to keep everyone as safe as possible. Also advised if needed EMS could even go check on her. She verbalized appreciation for the call and is hopeful for answers with the pending tests.

## 2018-12-30 ENCOUNTER — Ambulatory Visit
Admission: RE | Admit: 2018-12-30 | Discharge: 2018-12-30 | Disposition: A | Discharge status: Home / Self Care | Payer: PRIVATE HEALTH INSURANCE | Source: Ambulatory Visit | Attending: Neurology | Admitting: Neurology

## 2018-12-30 ENCOUNTER — Telehealth: Payer: Self-pay | Admitting: Neurology

## 2018-12-30 DIAGNOSIS — M6281 Muscle weakness (generalized): Secondary | ICD-10-CM

## 2018-12-30 DIAGNOSIS — R292 Abnormal reflex: Secondary | ICD-10-CM

## 2018-12-30 DIAGNOSIS — R531 Weakness: Secondary | ICD-10-CM

## 2018-12-30 DIAGNOSIS — R202 Paresthesia of skin: Secondary | ICD-10-CM

## 2018-12-30 DIAGNOSIS — R29898 Other symptoms and signs involving the musculoskeletal system: Secondary | ICD-10-CM

## 2018-12-30 DIAGNOSIS — R200 Anesthesia of skin: Secondary | ICD-10-CM

## 2018-12-30 DIAGNOSIS — R2990 Unspecified symptoms and signs involving the nervous system: Secondary | ICD-10-CM

## 2018-12-30 MED ORDER — GADOBENATE DIMEGLUMINE 529 MG/ML IV SOLN: 14.0000 mL | Freq: Once | INTRAVENOUS | Status: DC | PRN

## 2018-12-30 NOTE — Telephone Encounter (Signed)
Pt called stating she was not able to go through with her MRI due to being claustrophobic. Pt is needing something called in for her to the [facility] on [address] to calm her down for her next appt. Please advise.

## 2018-12-31 ENCOUNTER — Telehealth: Payer: Self-pay | Admitting: Neurology

## 2018-12-31 ENCOUNTER — Other Ambulatory Visit: Payer: Self-pay | Admitting: Neurology

## 2018-12-31 MED ORDER — ALPRAZOLAM 0.25 MG PO TABS: ORAL_TABLET | ORAL | 0 refills | Status: DC

## 2018-12-31 MED ORDER — GADOBENATE DIMEGLUMINE 529 MG/ML IV SOLN
14.0000 mL | Freq: Once | INTRAVENOUS | Status: AC | PRN
  Administered 2018-12-31: 14 mL via INTRAVENOUS

## 2018-12-31 NOTE — Telephone Encounter (Signed)
I called the pt and LVM (unclear if I can leave details) advising her request had been taken care of and she can call us back. Left office number and hours in message.

## 2018-12-31 NOTE — Telephone Encounter (Signed)
Sent in xanax with directions, thank you

## 2018-12-31 NOTE — Telephone Encounter (Signed)
error 

## 2018-12-31 NOTE — Telephone Encounter (Signed)
Prescription Xanax 0.25 mg. Instructions: Take 1-2 tabs (0.25mg -0.50mg ) 30-60 minutes before procedure. May repeat if needed.Do not drive.  When the pt calls back, please give her the instructions above and let us know if she has any questions.

## 2019-01-01 ENCOUNTER — Other Ambulatory Visit: Payer: Self-pay

## 2019-01-01 ENCOUNTER — Ambulatory Visit (INDEPENDENT_AMBULATORY_CARE_PROVIDER_SITE_OTHER): Payer: Self-pay | Admitting: Neurology

## 2019-01-01 ENCOUNTER — Telehealth: Payer: Self-pay | Admitting: *Deleted

## 2019-01-01 DIAGNOSIS — R292 Abnormal reflex: Secondary | ICD-10-CM

## 2019-01-01 DIAGNOSIS — Z0289 Encounter for other administrative examinations: Secondary | ICD-10-CM

## 2019-01-01 DIAGNOSIS — R2990 Unspecified symptoms and signs involving the nervous system: Secondary | ICD-10-CM

## 2019-01-01 DIAGNOSIS — R29898 Other symptoms and signs involving the musculoskeletal system: Secondary | ICD-10-CM

## 2019-01-01 DIAGNOSIS — M6281 Muscle weakness (generalized): Secondary | ICD-10-CM

## 2019-01-01 DIAGNOSIS — R202 Paresthesia of skin: Secondary | ICD-10-CM

## 2019-01-01 DIAGNOSIS — R200 Anesthesia of skin: Secondary | ICD-10-CM

## 2019-01-01 DIAGNOSIS — G5730 Lesion of lateral popliteal nerve, unspecified lower limb: Secondary | ICD-10-CM

## 2019-01-01 DIAGNOSIS — R531 Weakness: Secondary | ICD-10-CM

## 2019-01-01 NOTE — Patient Instructions (Addendum)
Common Peroneal Nerve Entrapment  Common peroneal nerve entrapment is a condition that can make it hard to lift a foot. The condition results from pressure on a nerve in the lower leg called the common peroneal nerve. Your common peroneal nerve provides feeling to your outer lower leg and foot. It also supplies the muscles that move your foot and toes upward and outward. What are the causes? This condition may be caused by:  Sitting cross-legged, squatting, or kneeling for long periods of time.  A hard, direct hit to the side of the lower leg.  Swelling from a knee injury.  A break (fracture) in one of the lower leg bones.  Wearing a boot or cast that ends just below the knee.  A growth or cyst near the nerve. What increases the risk? This condition is more likely to develop in people who play:  Contact sports, such as football or hockey.  Sports where you wear high and stiff boots, such as skiing. What are the signs or symptoms? Symptoms of this condition include:  Trouble lifting your foot up (foot drop).  Tripping often.  Your foot hitting the ground harder than normal as you walk.  Numbness, tingling, or pain in the outside of the knee, outside of the lower leg, and top of the foot.  Sensitivity to pressure on the front or side of the leg. How is this diagnosed? This condition may be diagnosed based on:  Your symptoms.  Your medical history.  A physical exam.  Tests, such as: ? An X-ray to check the bones of your knee and leg. ? MRI to check tendons that attach to the side of your knee. ? An ultrasound to check for a growth or cyst. ? An electromyogram (EMG) to check your nerves. During your physical exam, your health care provider will check for numbness in your leg and test the strength of your lower leg muscles. He or she may tap the side of your lower leg to see if that causes tingling. How is this treated? Treatment for this condition may  include:  Avoiding activities that make symptoms worse.  Using a brace to hold up your foot and toes.  Taking anti-inflammatory pain medicines to relieve swelling and lessen pain.  Having medicines injected into your ankle joint to lessen pain and swelling.  Doing exercises to help you regain or maintain movement (physical therapy).  Surgery to take pressure off the nerve. This may be needed if there is no improvement after 2-3 months or if there is a growth pushing on the nerve.  Returning gradually to full activity. Follow these instructions at home: If you have a brace:  Wear it as told by your health care provider. Remove it only as told by your health care provider.  Loosen the brace if your toes tingle, become numb, or turn cold and blue.  Keep the brace clean.  If the brace is not waterproof: ? Do not let it get wet. ? Cover it with a watertight covering when you take a bath or a shower.  Ask your health care provider when it is safe to drive with a brace on your foot. Activity  Return to your normal activities as told by your health care provider. Ask your health care provider what activities are safe for you.  Do not do any activities that make pain or swelling worse.  Do exercises as told by your health care provider. General instructions  Take over-the-counter and prescription medicines  only as told by your health care provider.  Do not put your full weight on your knee until your health care provider says you can. Use crutches as directed by your health care provider.  Keep all follow-up visits as told by your health care provider. This is important. How is this prevented?  Wear supportive footwear that is appropriate for your athletic activity.  Avoid athletic activities that cause ankle pain or swelling.  Wear protective padding over your lower legs when playing contact sports.  Make sure your boots do not put extra pressure on the area just below your  knees.  Do not sit cross-legged for long periods of time. Contact a health care provider if:  Your symptoms do not get better in 2-3 months.  The weakness or numbness in your leg or foot gets worse. Summary  Common peroneal nerve entrapment is a condition that results from pressure on a nerve in the lower leg called the common peroneal nerve.  This condition may be caused by a hard hit, swelling, a fracture, or a cyst in the lower leg.  Treatment may include rest, a brace, medicines, and physical therapy. Sometimes surgery is needed.  Do not do any activities that make pain or swelling worse. This information is not intended to replace advice given to you by your health care provider. Make sure you discuss any questions you have with your health care provider. Document Released: 01/01/2005 Document Revised: 11/11/2017 Document Reviewed: 11/11/2017 Elsevier Patient Education  2020 [employer].

## 2019-01-01 NOTE — Progress Notes (Addendum)
History: This is a very nice 53 year old female with a past medical history of chronic back pain, radicular symptoms, paresthesias, weakness in the lower extremities that started in 1998 without etiology found, she has been to multiple physicians, surgeons, physical therapists, had acupuncture and chiropractic, been to orthopedists and pain specialist and had multiple interventions and medications without significant relief.  She also reports numbness in her face, and both hands, sensory changes up to above the knees, sore muscles and spasms, numbness both hands and in the face specifically the whole mouth.  Despite all her efforts no etiology has been found.  She seen neurology in the past who suggested there may be some functional overlay.  I recommend, in addition to all her extensive work-ups in the past, that she have an MRI of the brain and the cervical spine for any central causes such as multiple sclerosis.  I also recommend an emg/ncs.  Reviewed workup to date with patient after emg/ncs. 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 The peroneal neuropathy could be causing the sensory problems in her legs. 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 She has chronic pain so I also recommend seeing her pain specialist and/or going to PT. EMG/NCS, MRI of the brain and cervical spine showed no cause for upper extremity symptoms. Today we will order and extensive panel of blood work to see if there could be any other findings to explain her upper extremity and face symptoms.  Orders Placed This Encounter  Procedures  . B12 and Folate Panel  . Methylmalonic Acid, Serum  . ANA w/Reflex  . Rheumatoid factor  . Heavy Metals, Blood  . Vitamin B6  . IFE and PE, Serum  . Sjogren's syndrome  antibods(ssa + ssb)  . Pan-ANCA  . Vitamin B1  . B. burgdorfi Antibody   A total of 25  minutes was spent face-to-face with this patient. Over half this time was spent on counseling patient on the  1. Peroneal neuropathy, unspecified laterality   2. Numbness and tingling of left side of face   3. Numbness and tingling of both legs   4. Weakness of both legs   5. Numbness and tingling in both hands   6. Numbness of face   7. Progressive focal motor weakness   8. Multiple neurological symptoms   9. 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  This does not include time spent on emg/ncs.

## 2019-01-01 NOTE — Telephone Encounter (Signed)
I already discussed in detail with her, avoid crossing legs, squatting, bending for long periods like sitting cross legged etc. I have no medication recommendations thanks

## 2019-01-01 NOTE — Telephone Encounter (Signed)
LVM advising patient of all of Dr Cathren Laine advice. I advised she'll get a call when lab results are back. Left office #.

## 2019-01-01 NOTE — Telephone Encounter (Signed)
Pt here for EMG today. I spoke with Elmyra Ricks who will inform pt of Xanax prescription.

## 2019-01-01 NOTE — Telephone Encounter (Signed)
Called patient and informed her the MRI of the brain and cervical spine are normal. Dr Jaynee Eagles stated there's nothing to explain her symptoms.She will await the results of the blood work. The patient asked what she can do to feel better from the perineal nerve impingement found on NCS today. I advised will send her question to Dr Jaynee Eagles and call her back. She verbalized understanding, appreciation.

## 2019-01-01 NOTE — Progress Notes (Signed)
See procedure note.

## 2019-01-05 NOTE — Telephone Encounter (Signed)
Pt called in and stated she received her results through mychart and would like a call back to discuss

## 2019-01-05 NOTE — Progress Notes (Addendum)
Full Name: Sheena Gray Gender: Female MRN #: [id number] Date of Birth: 23-Aug-1965    Visit Date: 01/01/2019 07:31 Age: 53 Years Examining Physician: Sarina Ill, MD  Referring Physician: Suella Broad, MD  History: 53 year old female with a past medical history of chronic back pain, radicular symptoms, paresthesias, weakness in the lower extremities  Summary: The right peroneal motor nerve showed reduced amplitude(0.60mv, N>2) and decreased conduction velocity(fib head to ankle, 57m/s, N>44)  and decreased conduction velocity(pop fossa to fib head, 6m/s, N>44). The left peroneal motor nerve showed delayed distal onset latency(10.44ms, N<6.5) and reduced amplitude(0.62mv, N>2). The left superficial peroneal sensory nerve showed no response. The right superficial peroneal sensory nerve showed reduced amplitude (3uV, N>6). The right peroneal F wave showed delayed latency(56.3, N<56). The right peroneal F wave showed no response. All remaining nerves (as indicated in the following tables) were within normal limits. All muscles (as indicated in the following tables) were within normal limits.       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 No evidence of lumbar radiculopathy, polyneuropathy or muscle disorder. EMG needle study did not show any acute or ongoing denervation.    Sarina Ill M.D.  Cc: Suella Broad, MD and Leanna Battles, MD  [facility] [place], [place] [address] Tel: [phone] Fax: [phone]         [facility]    Nerve / Sites Muscle Latency Ref. Amplitude Ref. Rel Amp Segments Distance Velocity Ref. Area    ms ms mV mV %  cm m/s m/s mVms  R Median - APB     Wrist APB 3.3 ?4.4 6.1 ?4.0 100 Wrist - APB 7   16.6     Upper arm APB 7.3  5.4  87.8 Upper arm - Wrist 23 57 ?49 15.6  L Median - APB     Wrist APB 3.0 ?4.4 4.8 ?4.0 100 Wrist - APB 7   15.5     Upper arm APB 6.9   4.6  96.1 Upper arm - Wrist 22 57 ?49 15.5  R Ulnar - ADM     Wrist ADM 2.3 ?3.3 12.6 ?6.0 100 Wrist - ADM 7   37.3     B.Elbow ADM 5.5  11.4  90.5 B.Elbow - Wrist 20 64 ?49 35.4     A.Elbow ADM 7.1  11.4  99.8 A.Elbow - B.Elbow 10 62 ?49 35.6         A.Elbow - Wrist      L Ulnar - ADM     Wrist ADM 2.4 ?3.3 12.4 ?6.0 100 Wrist - ADM 7   36.5     B.Elbow ADM 5.6  11.9  95.7 B.Elbow - Wrist 20 61 ?49 32.3     A.Elbow ADM 7.3  11.0  92.5 A.Elbow - B.Elbow 10 60 ?49 30.6         A.Elbow - Wrist      R Peroneal - EDB     Ankle EDB 4.6 ?6.5 0.5 ?2.0 100 Ankle - EDB 9   2.1     Fib head EDB 12.6  0.6  116 Fib head - Ankle 31 39 ?44 3.1     Pop fossa EDB 15.1  0.8  125 Pop fossa - Fib head 10 40 ?44 4.4         Pop fossa - Ankle      L Peroneal - EDB     Ankle EDB 10.1 ?  6.5 0.2 ?2.0 100 Ankle - EDB 9   1.1     Fib head EDB 16.3  1.1  540 Fib head - Ankle 31 50 ?44 1.0     Pop fossa EDB 18.3  1.2  107 Pop fossa - Fib head 10 51 ?44 1.4         Pop fossa - Ankle      R Tibial - AH     Ankle AH 4.2 ?5.8 18.9 ?4.0 100 Ankle - AH 9   54.4     Pop fossa AH 12.8  12.8  67.5 Pop fossa - Ankle 39 45 ?41 41.9  L Tibial - AH     Ankle AH 4.1 ?5.8 16.8 ?4.0 100 Ankle - AH 9   45.2     Pop fossa AH 12.7  11.7  69.6 Pop fossa - Ankle 39 46 ?41 38.9                       SNC    Nerve / Sites Rec. Site Peak Lat Ref.  Amp Ref. Segments Distance Peak Diff Ref.    ms ms V V  cm ms ms  R Sural - Ankle (Calf)     Calf Ankle 3.1 ?4.4 11 ?6 Calf - Ankle 14    L Sural - Ankle (Calf)     Calf Ankle 4.3 ?4.4 13 ?6 Calf - Ankle 14    R Superficial peroneal - Ankle     Lat leg Ankle 4.0 ?4.4 3 ?6 Lat leg - Ankle 14    L Superficial peroneal - Ankle     Lat leg Ankle NR ?4.4 NR ?6 Lat leg - Ankle 14    R Median, Ulnar - Transcarpal comparison     Median Palm Wrist 2.1 ?2.2 68 ?35 Median Palm - Wrist 8       Ulnar Palm Wrist 2.0 ?2.2 27 ?12 Ulnar Palm - Wrist 8          Median Palm - Ulnar Palm  0.1 ?0.4    L Median, Ulnar - Transcarpal comparison     Median Palm Wrist 1.9 ?2.2 76 ?35 Median Palm - Wrist 8       Ulnar Palm Wrist 2.0 ?2.2 32 ?12 Ulnar Palm - Wrist 8          Median Palm - Ulnar Palm  -0.1 ?0.4  R Median - Orthodromic (Dig II, Mid palm)     Dig II Wrist 2.8 ?3.4 17 ?10 Dig II - Wrist 13    L Median - Orthodromic (Dig II, Mid palm)     Dig II Wrist 2.7 ?3.4 15 ?10 Dig II - Wrist 13    R Ulnar - Orthodromic, (Dig V, Mid palm)     Dig V Wrist 2.7 ?3.1 12 ?5 Dig V - Wrist 11    L Ulnar - Orthodromic, (Dig V, Mid palm)     Dig V Wrist 2.7 ?3.1 10 ?5 Dig V - Wrist 18                           F  Wave    Nerve F Lat Ref.   ms ms  R Peroneal - EDB 56.3 ?56.0  R Tibial - AH 44.8 ?56.0  L Peroneal - EDB NR ?56.0  L Tibial - AH 50.5 ?56.0  R Median - APB 29.8 ?31.0  R  Ulnar - ADM 26.0 ?32.0  L Median - APB 26.7 ?31.0  L Ulnar - ADM 26.5 ?32.0                     EMG Summary Table    Spontaneous MUAP Recruitment  Muscle IA Fib PSW Fasc Other Amp Dur. Poly Pattern  L. Iliopsoas Normal None None None _______ Normal Normal Normal Normal  L. Vastus medialis Normal None None None _______ Normal Normal Normal Normal  L. Peroneus longus Normal None None None _______ Normal Normal Normal Normal  L. Tibialis anterior Normal None None None _______ Normal Normal Normal Normal  L. Tibialis posterior Normal None None None _______ Normal Normal Normal Normal  L. Extensor hallucis longus Normal None None None _______ Normal Normal Normal Normal  L. Abductor hallucis Normal None None None _______ Normal Normal Normal Normal  L. Biceps femoris (long head) Normal None None None _______ Normal Normal Normal Normal  L. Gastrocnemius (Medial head) Normal None None None _______ Normal Normal Normal Normal  L. Gluteus maximus Normal None None None _______ Normal Normal Normal Normal  L. Gluteus medius Normal None None None _______ Normal Normal Normal Normal  L. Lumbar paraspinals (low) Normal  None None None _______ Normal Normal Normal Normal  L. Deltoid Normal None None None _______ Normal Normal Normal Normal  L. Triceps brachii Normal None None None _______ Normal Normal Normal Normal  L. Pronator teres Normal None None None _______ Normal Normal Normal Normal  L. Opponens pollicis Normal None None None _______ Normal Normal Normal Normal  L. First dorsal interosseous Normal None None None _______ Normal Normal Normal Normal  L. Cervical paraspinals (low) Normal None None None _______ Normal Normal Normal Normal

## 2019-01-06 NOTE — Telephone Encounter (Signed)
I spoke with the patient. She understands not all labs are back yet. Per Dr. Jaynee Eagles, so far labs are normal. We will call if remaining are abnormal. I advised vitamin levels are still pending for example. Pt verbalized appreciation. She also stated she was given information on peroneal neuropathy and she is not crossing her legs, takes advil during the day, amitriptyline at night, but it is getting worse each day. Worse by 7 pm. Long term she wants to know what to do. She said this can wait for Dr. Cathren Laine return to office next week. I offered on-call MD if needed in the meantime. She verbalized appreciation for the call.

## 2019-01-06 NOTE — Telephone Encounter (Signed)
Per Dr. Jaynee Eagles, elevated pan anca not clinically significant because of the other ancas are normal.

## 2019-01-07 NOTE — Telephone Encounter (Signed)
Patient has chronic pain and takes multiple medications, I am not comfortable prescribing more or changing her meds, she should see whoever has been managing her pain medications to date. I also recommend seeing a therapist to discuss her chronic pain and learning techniques to manage. I can prescribe PT thanks.

## 2019-01-08 LAB — MULTIPLE MYELOMA PANEL, SERUM
Albumin SerPl Elph-Mcnc: 4.4 g/dL (ref 2.9–4.4)
Albumin/Glob SerPl: 1.4 (ref 0.7–1.7)
Alpha 1: 0.2 g/dL (ref 0.0–0.4)
Alpha2 Glob SerPl Elph-Mcnc: 0.8 g/dL (ref 0.4–1.0)
B-Globulin SerPl Elph-Mcnc: 1 g/dL (ref 0.7–1.3)
Gamma Glob SerPl Elph-Mcnc: 1.1 g/dL (ref 0.4–1.8)
Globulin, Total: 3.2 g/dL (ref 2.2–3.9)
IgA/Immunoglobulin A, Serum: 176 mg/dL (ref 87–352)
IgG (Immunoglobin G), Serum: 969 mg/dL (ref 586–1602)
IgM (Immunoglobulin M), Srm: 132 mg/dL (ref 26–217)
Total Protein: 7.6 g/dL (ref 6.0–8.5)

## 2019-01-08 LAB — VITAMIN B6: Vitamin B6: 20.6 ug/L (ref 2.0–32.8)

## 2019-01-08 LAB — PAN-ANCA
ANCA Proteinase 3: 3.8 U/mL — ABNORMAL HIGH (ref 0.0–3.5)
Atypical pANCA: <1:20 {titer}
C-ANCA: <1:20 {titer}
Myeloperoxidase Ab: <9 U/mL (ref 0.0–9.0)
P-ANCA: <1:20 {titer}

## 2019-01-08 LAB — SJOGREN'S SYNDROME ANTIBODS(SSA + SSB)
ENA SSA (RO) Ab: <0.2 AI (ref 0.0–0.9)
ENA SSB (LA) Ab: <0.2 AI (ref 0.0–0.9)

## 2019-01-08 LAB — METHYLMALONIC ACID, SERUM: Methylmalonic Acid: 62 nmol/L (ref 0–378)

## 2019-01-08 LAB — VITAMIN B1: Thiamine: 167.5 nmol/L (ref 66.5–200.0)

## 2019-01-08 LAB — B12 AND FOLATE PANEL
Folate: 12.2 ng/mL (ref >3.0)
Vitamin B-12: 691 pg/mL (ref 232–1245)

## 2019-01-08 LAB — RHEUMATOID FACTOR: Rheumatoid fact SerPl-aCnc: <10 IU/mL (ref 0.0–13.9)

## 2019-01-08 LAB — ANA W/REFLEX: Anti Nuclear Antibody (ANA): NEGATIVE

## 2019-01-08 LAB — B. BURGDORFI ANTIBODIES: Lyme IgG/IgM Ab: <0.91 {ISR} (ref 0.00–0.90)

## 2019-01-08 LAB — HEAVY METALS, BLOOD
Arsenic: 5 ug/L (ref 2–23)
Lead, Blood: <1 ug/dL (ref 0–4)
Mercury: <1 ug/L (ref 0.0–14.9)

## 2019-01-10 NOTE — Procedures (Signed)
Full Name: Sheena Gray Gender: Female MRN #: [id number] Date of Birth: [birth year]    Visit Date: 01/01/2019 07:31 Age: 53 Years Examining Physician: Sarina Ill, MD  Referring Physician: Suella Broad, MD  History: 53 year old female with a past medical history of chronic back pain, radicular symptoms, paresthesias, weakness in the lower extremities  Summary: The right peroneal motor nerve showed reduced amplitude(0.1mv, N>2) and decreased conduction velocity(fib head to ankle, 80m/s, N>44)  and decreased conduction velocity(pop fossa to fib head, 54m/s, N>44). The left peroneal motor nerve showed delayed distal onset latency(10.25ms, N<6.5) and reduced amplitude(0.57mv, N>2). The left superficial peroneal sensory nerve showed no response. The right superficial peroneal sensory nerve showed reduced amplitude (3uV, N>6). The right peroneal F wave showed delayed latency(56.3, N<56). The right peroneal F wave showed no response. All remaining nerves (as indicated in the following tables) were within normal limits. All muscles (as indicated in the following tables) were within normal limits.       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 No evidence of lumbar radiculopathy, polyneuropathy or muscle disorder. EMG needle study did not show any acute or ongoing denervation.    Sarina Ill M.D.  Cc: Suella Broad, MD and Leanna Battles, MD  [facility] [place], [place] [address] Tel: [phone] Fax: [phone]         [facility]    Nerve / Sites Muscle Latency Ref. Amplitude Ref. Rel Amp Segments Distance Velocity Ref. Area    ms ms mV mV %  cm m/s m/s mVms  R Median - APB     Wrist APB 3.3 ?4.4 6.1 ?4.0 100 Wrist - APB 7   16.6     Upper arm APB 7.3  5.4  87.8 Upper arm - Wrist 23 57 ?49 15.6  L Median - APB     Wrist APB 3.0 ?4.4 4.8 ?4.0 100 Wrist - APB 7   15.5     Upper arm APB 6.9   4.6  96.1 Upper arm - Wrist 22 57 ?49 15.5  R Ulnar - ADM     Wrist ADM 2.3 ?3.3 12.6 ?6.0 100 Wrist - ADM 7   37.3     B.Elbow ADM 5.5  11.4  90.5 B.Elbow - Wrist 20 64 ?49 35.4     A.Elbow ADM 7.1  11.4  99.8 A.Elbow - B.Elbow 10 62 ?49 35.6         A.Elbow - Wrist      L Ulnar - ADM     Wrist ADM 2.4 ?3.3 12.4 ?6.0 100 Wrist - ADM 7   36.5     B.Elbow ADM 5.6  11.9  95.7 B.Elbow - Wrist 20 61 ?49 32.3     A.Elbow ADM 7.3  11.0  92.5 A.Elbow - B.Elbow 10 60 ?49 30.6         A.Elbow - Wrist      R Peroneal - EDB     Ankle EDB 4.6 ?6.5 0.5 ?2.0 100 Ankle - EDB 9   2.1     Fib head EDB 12.6  0.6  116 Fib head - Ankle 31 39 ?44 3.1     Pop fossa EDB 15.1  0.8  125 Pop fossa - Fib head 10 40 ?44 4.4         Pop fossa - Ankle      L Peroneal - EDB     Ankle EDB 10.1 ?  6.5 0.2 ?2.0 100 Ankle - EDB 9   1.1     Fib head EDB 16.3  1.1  540 Fib head - Ankle 31 50 ?44 1.0     Pop fossa EDB 18.3  1.2  107 Pop fossa - Fib head 10 51 ?44 1.4         Pop fossa - Ankle      R Tibial - AH     Ankle AH 4.2 ?5.8 18.9 ?4.0 100 Ankle - AH 9   54.4     Pop fossa AH 12.8  12.8  67.5 Pop fossa - Ankle 39 45 ?41 41.9  L Tibial - AH     Ankle AH 4.1 ?5.8 16.8 ?4.0 100 Ankle - AH 9   45.2     Pop fossa AH 12.7  11.7  69.6 Pop fossa - Ankle 39 46 ?41 38.9                       SNC    Nerve / Sites Rec. Site Peak Lat Ref.  Amp Ref. Segments Distance Peak Diff Ref.    ms ms V V  cm ms ms  R Sural - Ankle (Calf)     Calf Ankle 3.1 ?4.4 11 ?6 Calf - Ankle 14    L Sural - Ankle (Calf)     Calf Ankle 4.3 ?4.4 13 ?6 Calf - Ankle 14    R Superficial peroneal - Ankle     Lat leg Ankle 4.0 ?4.4 3 ?6 Lat leg - Ankle 14    L Superficial peroneal - Ankle     Lat leg Ankle NR ?4.4 NR ?6 Lat leg - Ankle 14    R Median, Ulnar - Transcarpal comparison     Median Palm Wrist 2.1 ?2.2 68 ?35 Median Palm - Wrist 8       Ulnar Palm Wrist 2.0 ?2.2 27 ?12 Ulnar Palm - Wrist 8          Median Palm - Ulnar Palm  0.1 ?0.4    L Median, Ulnar - Transcarpal comparison     Median Palm Wrist 1.9 ?2.2 76 ?35 Median Palm - Wrist 8       Ulnar Palm Wrist 2.0 ?2.2 32 ?12 Ulnar Palm - Wrist 8          Median Palm - Ulnar Palm  -0.1 ?0.4  R Median - Orthodromic (Dig II, Mid palm)     Dig II Wrist 2.8 ?3.4 17 ?10 Dig II - Wrist 13    L Median - Orthodromic (Dig II, Mid palm)     Dig II Wrist 2.7 ?3.4 15 ?10 Dig II - Wrist 13    R Ulnar - Orthodromic, (Dig V, Mid palm)     Dig V Wrist 2.7 ?3.1 12 ?5 Dig V - Wrist 11    L Ulnar - Orthodromic, (Dig V, Mid palm)     Dig V Wrist 2.7 ?3.1 10 ?5 Dig V - Wrist 71                           F  Wave    Nerve F Lat Ref.   ms ms  R Peroneal - EDB 56.3 ?56.0  R Tibial - AH 44.8 ?56.0  L Peroneal - EDB NR ?56.0  L Tibial - AH 50.5 ?56.0  R Median - APB 29.8 ?31.0  R  Ulnar - ADM 26.0 ?32.0  L Median - APB 26.7 ?31.0  L Ulnar - ADM 26.5 ?32.0                     EMG Summary Table    Spontaneous MUAP Recruitment  Muscle IA Fib PSW Fasc Other Amp Dur. Poly Pattern  L. Iliopsoas Normal None None None _______ Normal Normal Normal Normal  L. Vastus medialis Normal None None None _______ Normal Normal Normal Normal  L. Peroneus longus Normal None None None _______ Normal Normal Normal Normal  L. Tibialis anterior Normal None None None _______ Normal Normal Normal Normal  L. Tibialis posterior Normal None None None _______ Normal Normal Normal Normal  L. Extensor hallucis longus Normal None None None _______ Normal Normal Normal Normal  L. Abductor hallucis Normal None None None _______ Normal Normal Normal Normal  L. Biceps femoris (long head) Normal None None None _______ Normal Normal Normal Normal  L. Gastrocnemius (Medial head) Normal None None None _______ Normal Normal Normal Normal  L. Gluteus maximus Normal None None None _______ Normal Normal Normal Normal  L. Gluteus medius Normal None None None _______ Normal Normal Normal Normal  L. Lumbar paraspinals (low) Normal  None None None _______ Normal Normal Normal Normal  L. Deltoid Normal None None None _______ Normal Normal Normal Normal  L. Triceps brachii Normal None None None _______ Normal Normal Normal Normal  L. Pronator teres Normal None None None _______ Normal Normal Normal Normal  L. Opponens pollicis Normal None None None _______ Normal Normal Normal Normal  L. First dorsal interosseous Normal None None None _______ Normal Normal Normal Normal  L. Cervical paraspinals (low) Normal None None None _______ Normal Normal Normal Normal

## 2019-01-12 ENCOUNTER — Encounter: Payer: Self-pay | Admitting: *Deleted

## 2019-01-12 ENCOUNTER — Telehealth: Payer: Self-pay | Admitting: Neurology

## 2019-01-12 DIAGNOSIS — G5730 Lesion of lateral popliteal nerve, unspecified lower limb: Secondary | ICD-10-CM

## 2019-01-12 NOTE — Telephone Encounter (Signed)
PT referral placed to [facility] at patient's request.

## 2019-01-12 NOTE — Telephone Encounter (Signed)
Patient returned call and stated Physical Therapy would be a good idea and she wants to know if the referral can be sent to [facility].

## 2019-01-12 NOTE — Telephone Encounter (Signed)
From note dated 01/01/19 LVM informing her that Dr Jaynee Eagles stated she has chronic pain and takes multiple medications, Dr Jaynee Eagles is not comfortable prescribing more or changing her meds.  Dr Jaynee Eagles stated she should see whoever has been managing her pain medications to date. Dr Jaynee Eagles also recommends seeing a therapist to discuss her chronic pain and learning techniques to manage. She  can prescribe Physical Therapy if patient would like to try. Left # for call back.

## 2019-01-12 NOTE — Telephone Encounter (Signed)
Pt called stating that her pain in her legs and back is intensifying and she is wanting to know what the next step is to take alleviate the pain. Please advise.

## 2019-01-12 NOTE — Addendum Note (Signed)
Addended by: Minna Antis on: 01/12/2019 02:30 PM   Modules accepted: Orders

## 2019-01-12 NOTE — Telephone Encounter (Signed)
See noted dated 01/12/19. Patient referred for PT, [facility].

## 2019-01-12 NOTE — Telephone Encounter (Signed)
Thank you ! Place referral for peroneal neuropathy. Thanks!

## 2019-01-14 NOTE — Telephone Encounter (Signed)
Patient called stating she had a couple of questions in regards to her results she states she doesn't know what they mean and if all lab results are in.. Patient would like a CB   Please follow up.

## 2019-01-14 NOTE — Telephone Encounter (Signed)
Spoke with pt and answered her questions regarding labs (the vitamins that were pending are normal), PT, plan. Pt aware [facility] will call her to schedule the appt for PT. She understands the referral was specifically for PT and not for Dr. Nelva Bush. Pt doesn't feel that it is a good fit with him. She understands to discuss a pain management referral with PCP. Pt understands Dr. Philip Aspen got the notes from our office. Pt gathers that no f/u is needed with neurology and wants to clarify if this is what Dr. Jaynee Eagles recommends. I told pt we would call her back next week. Office closed for [employer]. She verbalized appreciation for the call.   I sent the labs to Dr. Philip Aspen PCP via fax feature in [place].

## 2019-01-19 NOTE — Telephone Encounter (Signed)
Sheena Gray, this is going to be more than just a 5 minute call. Can you schedule a video or phone appointment to discuss with patient please? thanks

## 2019-01-21 ENCOUNTER — Ambulatory Visit: Payer: No Typology Code available for payment source

## 2019-01-21 ENCOUNTER — Ambulatory Visit (INDEPENDENT_AMBULATORY_CARE_PROVIDER_SITE_OTHER): Payer: No Typology Code available for payment source | Admitting: Cardiology

## 2019-01-21 ENCOUNTER — Encounter: Payer: Self-pay | Admitting: Cardiology

## 2019-01-21 ENCOUNTER — Other Ambulatory Visit: Payer: Self-pay

## 2019-01-21 VITALS — BP 117/76 | HR 79 | Temp 97.6°F | Ht 66.0 in | Wt 152.5 lb

## 2019-01-21 DIAGNOSIS — G5730 Lesion of lateral popliteal nerve, unspecified lower limb: Secondary | ICD-10-CM | POA: Diagnosis not present

## 2019-01-21 DIAGNOSIS — Z8241 Family history of sudden cardiac death: Secondary | ICD-10-CM | POA: Diagnosis not present

## 2019-01-21 DIAGNOSIS — R9431 Abnormal electrocardiogram [ECG] [EKG]: Secondary | ICD-10-CM | POA: Diagnosis not present

## 2019-01-21 DIAGNOSIS — R002 Palpitations: Secondary | ICD-10-CM

## 2019-01-21 DIAGNOSIS — R000 Tachycardia, unspecified: Secondary | ICD-10-CM | POA: Diagnosis not present

## 2019-01-21 NOTE — Telephone Encounter (Signed)
I called patient and got her set up with a telephone call to discuss this information.

## 2019-01-21 NOTE — Progress Notes (Signed)
Primary Physician/Referring:  Leanna Battles, MD  Patient ID: Sheena Gray, female    DOB: 27-Jan-1965, 54 y.o.   MRN: [id number]  Chief Complaint  Patient presents with  . New Patient (Initial Visit)    Preventative Health Care   HPI:    Sheena Gray  is a 54 y.o. Caucasian female with chronic back pain, extensive evaluation by neurology and recently was evaluated by Dr. Lavell Anchors and diagnosed with nonspecific bilateral peroneal mononeuropathy, family history of sudden cardiac death in her father at age 16 was evaluated at [facility] and no cause found in 1980, no other family members with sudden cardiac death (suspected to be suicide, no CAD), mild hyperlipidemia, tobacco use for 13 years quit in 2016 referred to me for cardiac risk stratification. and palpitations.  Symptoms are very vague.  She continues to have severe pain throughout her body.  Sometimes she wakes up feeling her heart is racing.  She is also noticed that the heart rate has been elevated recently was past few months.  No chest pain or dyspnea.  Past Medical History:  Diagnosis Date  . Chronic pain   . Gallstones   . Kidney stones   . Osteopenia   . Peroneal mononeuropathy    Past Surgical History:  Procedure Laterality Date  . CHOLECYSTECTOMY    . SHOULDER ARTHROSCOPY  2015   Social History   Tobacco Use  . Smoking status: Former Smoker    Years: 5.00    Types: Cigarettes    Quit date: 01/21/1999    Years since quitting: 20.0  . Smokeless tobacco: Never Used  . Tobacco comment: teenage and college years  Substance Use Topics  . Alcohol use: Not Currently    Comment: no   ROS  Review of Systems  Constitution: Negative for chills, decreased appetite, malaise/fatigue and weight gain.  Cardiovascular: Positive for palpitations. Negative for dyspnea on exertion, leg swelling and syncope.  Endocrine: Negative for cold intolerance.  Hematologic/Lymphatic: Does not bruise/bleed easily.    Musculoskeletal: Negative for joint swelling.  Gastrointestinal: Negative for abdominal pain, anorexia, change in bowel habit, hematochezia and melena.  Neurological: Positive for numbness and paresthesias. Negative for headaches and light-headedness.  Psychiatric/Behavioral: Positive for depression. Negative for substance abuse. The patient is nervous/anxious.   All other systems reviewed and are negative.  Objective  Blood pressure 117/76, pulse 79, temperature 97.6 F (36.4 C), height 5\' 6"  (1.676 m), weight 152 lb 8 oz (69.2 kg), SpO2 99 %.  Vitals with BMI 01/21/2019 11/26/2018  Height 5\' 6"  5\' 6"   Weight 152 lbs 8 oz 152 lbs  BMI [id number]  Systolic [id number]  Diastolic 76 74  Pulse 79 78     Physical Exam  Constitutional:  She is moderately built and well-nourished and appears to be in no acute distress.  HENT:  Head: Atraumatic.  Eyes: Conjunctivae are normal.  Neck: No JVD present. No thyromegaly present.  Cardiovascular: Normal rate, regular rhythm, normal heart sounds and intact distal pulses. Exam reveals no gallop.  No murmur heard. No leg edema, no JVD.  Pulmonary/Chest: Effort normal and breath sounds normal.  Abdominal: Soft. Bowel sounds are normal.  Musculoskeletal:        General: Normal range of motion.     Cervical back: Neck supple.  Neurological: She is alert.  Skin: Skin is warm and dry.  Psychiatric: She has a normal mood and affect.   Laboratory examination:   No results  for input(s): NA, K, CL, CO2, GLUCOSE, BUN, CREATININE, CALCIUM, GFRNONAA, GFRAA in the last 8760 hours. CrCl cannot be calculated (Patient's most recent lab result is older than the maximum 21 days allowed.).  CMP Latest Ref Rng & Units 01/01/2019 11/11/2010  Glucose 70 - 99 mg/dL - 98  BUN 6 - 23 mg/dL - 19  Creatinine 0.50 - 1.10 mg/dL - 0.88  Sodium 135 - 145 mEq/L - 135  Potassium 3.5 - 5.1 mEq/L - 3.4(L)  Chloride 96 - 112 mEq/L - 100  CO2 19 - 32 mEq/L - 23  Calcium 8.4  - 10.5 mg/dL - 10.2  Total Protein 6.0 - 8.5 g/dL 7.6 -   CBC Latest Ref Rng & Units 11/11/2010  WBC 4.0 - 10.5 K/uL 20.2(H)  Hemoglobin 12.0 - 15.0 g/dL 14.4  Hematocrit 36.0 - 46.0 % 41.4  Platelets 150 - 400 K/uL 216   Lipid Panel  No results found for: CHOL, TRIG, HDL, CHOLHDL, VLDL, LDLCALC, LDLDIRECT HEMOGLOBIN A1C No results found for: HGBA1C, MPG TSH No results for input(s): TSH in the last 8760 hours.  Labs 11/18/2018: B12 normal, myasthenia gravis panel negative, CRP 2.0 mg, total CK 168, magnesium 2.1. Medications and allergies   Allergies  Allergen Reactions  . Shellfish Allergy Nausea And Vomiting     Current Outpatient Medications  Medication Instructions  . ALPRAZolam (XANAX) 0.25 MG tablet Take 1-2 tabs (0.25mg -0.50mg ) 30-60 minutes before procedure. May repeat if needed.Do not drive.  Marland Kitchen amitriptyline (ELAVIL) 10 mg, Oral, At bedtime PRN  . Melatonin 5 MG TABS Oral, At bedtime PRN  . Multiple Vitamins-Minerals (CENTRUM SILVER 50+WOMEN PO) 1 tablet, Oral, Daily  . OVER THE COUNTER MEDICATION Alive Calcium bone formula   . sertraline (ZOLOFT) 100 mg, Oral, Daily   Radiology:  No results found.  Cardiac Studies:   None  Assessment     ICD-10-CM   1. Palpitations  R00.2 EKG 12-Lead    CARDIAC EVENT MONITOR    PCV ECHOCARDIOGRAM COMPLETE  2. Shortened PR interval  R94.31 CARDIAC EVENT MONITOR    PCV ECHOCARDIOGRAM COMPLETE  3. Family history of sudden cardiac death in father age 70Y no CAD  Z27.41   55. Peroneal mononeuropathy, unspecified laterality  G57.30   5. Tachycardia  R00.0 PCV ECHOCARDIOGRAM COMPLETE    EKG 01/20/2018: Sinus rhythm with short PR interval, PR interval is 108-110 ms, normal axis, incomplete right bundle branch block.  No evidence of ischemia, normal QT interval.     Recommendations:  No orders of the defined types were placed in this encounter.   Sheena Gray  is a 54 y.o. Caucasian female with chronic back pain, extensive  evaluation by neurology and recently was evaluated by Dr. Lavell Anchors and diagnosed with nonspecific bilateral peroneal mononeuropathy, family history of sudden cardiac death in her father at age 19 was evaluated at [facility] and no cause found in 1980, no other family members with sudden cardiac death (suspected to be suicide, no CAD), mild hyperlipidemia, tobacco use for 13 years quit in 2016 referred to me for cardiac risk stratification. and palpitations. Symptoms are very vague, I'm not sure if she has episodes of PSVT leading to anxiety attacks and "panic attacks".  She also complains of weak pain in her legs and tender points throughout her body.  She does have short PR interval, we performed and event monitor and also an echocardiogram to evaluate her symptoms of elevated heart rate and abnormal EKG.  With regard  to her aches and pains and tender spots on her body, could potentially evaluate for fibromyalgia.  Fortunately all her rheumatologic markers have been negative so far. OV in 4 weeks. She was Rx lovastatin, but has never filled it and has complete physical next month and will bring labs.   Adrian Prows, MD, [facility] 01/22/2019, 11:14 PM [facility] [place] Office: [phone]

## 2019-01-23 ENCOUNTER — Encounter: Payer: Self-pay | Admitting: Internal Medicine

## 2019-01-29 DIAGNOSIS — IMO0001 Reserved for inherently not codable concepts without codable children: Secondary | ICD-10-CM

## 2019-01-29 DIAGNOSIS — S8410XA Injury of peroneal nerve at lower leg level, unspecified leg, initial encounter: Secondary | ICD-10-CM

## 2019-01-29 HISTORY — DX: Reserved for inherently not codable concepts without codable children: IMO0001

## 2019-01-29 HISTORY — DX: Injury of peroneal nerve at lower leg level, unspecified leg, initial encounter: S84.10XA

## 2019-02-04 ENCOUNTER — Other Ambulatory Visit: Payer: Self-pay

## 2019-02-04 ENCOUNTER — Ambulatory Visit (INDEPENDENT_AMBULATORY_CARE_PROVIDER_SITE_OTHER): Payer: No Typology Code available for payment source

## 2019-02-04 DIAGNOSIS — R9431 Abnormal electrocardiogram [ECG] [EKG]: Secondary | ICD-10-CM

## 2019-02-04 DIAGNOSIS — R002 Palpitations: Secondary | ICD-10-CM

## 2019-02-04 DIAGNOSIS — R000 Tachycardia, unspecified: Secondary | ICD-10-CM

## 2019-02-05 ENCOUNTER — Other Ambulatory Visit: Payer: No Typology Code available for payment source

## 2019-02-09 ENCOUNTER — Other Ambulatory Visit: Payer: Self-pay

## 2019-02-09 ENCOUNTER — Ambulatory Visit (AMBULATORY_SURGERY_CENTER): Payer: Self-pay | Admitting: *Deleted

## 2019-02-09 VITALS — Temp 97.3°F | Ht 66.0 in | Wt 151.0 lb

## 2019-02-09 DIAGNOSIS — Z01818 Encounter for other preprocedural examination: Secondary | ICD-10-CM

## 2019-02-09 DIAGNOSIS — Z1211 Encounter for screening for malignant neoplasm of colon: Secondary | ICD-10-CM

## 2019-02-09 MED ORDER — SUPREP BOWEL PREP KIT 17.5-3.13-1.6 GM/177ML PO SOLN: 1.0000 | Freq: Once | ORAL | 0 refills | Status: AC

## 2019-02-09 NOTE — Progress Notes (Signed)
No egg or soy allergy known to patient  No issues with past sedation with any surgeries  or procedures, no intubation problems  No diet pills per patient No home 02 use per patient  No blood thinners per patient  Pt denies issues with constipation  No A fib or A flutter  EMMI video sent to pt's e mail   PT HAD ECHO 1-21 ef 60%, NO ISSUES- MONITOR COMPLETE BUT NO RESULTS- PT TO SEE DR Nadyne Coombes Thursday AND SHE WILL CALL AND LET us KNOW IF ANY ISSUES FOR COLON WITH DEEP SEDATION FROM A CARDIAC STANDPOINT   Due to the COVID-19 pandemic we are asking patients to follow these guidelines. Please only bring one care partner. Please be aware that your care partner may wait in the car in the parking lot or if they feel like they will be too hot to wait in the car, they may wait in the lobby on the 4th floor. All care partners are required to wear a mask the entire time (we do not have any that we can provide them), they need to practice social distancing, and we will do a Covid check for all patient's and care partners when you arrive. Also we will check their temperature and your temperature. If the care partner waits in their car they need to stay in the parking lot the entire time and we will call them on their cell phone when the patient is ready for discharge so they can bring the car to the front of the building. Also all patient's will need to wear a mask into building.

## 2019-02-10 ENCOUNTER — Telehealth: Payer: Self-pay | Admitting: *Deleted

## 2019-02-10 NOTE — Telephone Encounter (Signed)
Noted, I don;t really know what CNS therapy is I will discuss with patient at phone call tomorrow thanks

## 2019-02-10 NOTE — Telephone Encounter (Signed)
Patient is scheduled for a telephone visit tomorrow and wanted to make Dr. Jaynee Eagles aware of a specific question she has for the visit. States that [facility] wants to do CNS treatment and she wants to discuss their recommendation and get her thoughts.

## 2019-02-10 NOTE — Progress Notes (Signed)
Normal heart function and minor abnormality

## 2019-02-10 NOTE — Progress Notes (Signed)
Called pt to inform  her about her echo. Pt understood. Pt is having a colonoscopy in Feb, and would like to know if you have an concerns about her being sedation.  Please adivce

## 2019-02-11 ENCOUNTER — Ambulatory Visit (INDEPENDENT_AMBULATORY_CARE_PROVIDER_SITE_OTHER): Payer: Self-pay | Admitting: Neurology

## 2019-02-11 ENCOUNTER — Other Ambulatory Visit: Payer: Self-pay

## 2019-02-11 ENCOUNTER — Encounter: Payer: Self-pay | Admitting: Neurology

## 2019-02-11 DIAGNOSIS — R2990 Unspecified symptoms and signs involving the nervous system: Secondary | ICD-10-CM

## 2019-02-11 NOTE — Progress Notes (Signed)
NO CHARGE VISIT: I spoke to patient briefly on the phone today, I reviewed the work-up and findings, I did extensive lab panels to complement prior testing that she already had including Lyme, B1, B12, vitamin B1, pan ANCA, Sjogren's antibodies, IFE and PE, vitamin B6, heavy metals, rheumatoid factor, ANA with reflex, methylmalonic acid, B12 and folate panel which were all unremarkable,ANCA proteinase 3 was slightly elevated at 3.8 (3.5 upper limit) but negative c-ANCA, p-ANCA, myeloperoxidase antibodies, I just do not think that this is clinically significant for patient.  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 Melina Schools and Dr. Herma Mering.  She has been evaluated at [facility] with imaging, MRI of the lumbar spine, she is currently in physical therapy, I advised her to possibly discussed with physical therapy dry needling in addition to her other therapy.  We discussed that [facility] thought this might be central sensitization we discussed see RPS I am not sure she fulfills the diagnostic criteria for that but definitely follow-up with the pain specialists as this is their expertise.  MRI of the brain and the cervical spine were both negative for etiology for all her symptoms.  I apologized to patient that I could not find the etiology of her symptoms, she says she has been suffering with this for many many years but recently become worsening and prolonged episode over the last year, she reports chronic back pain, radicular symptoms, paresthesias, weakness in her lower extremities, EMG nerve conduction study did not show any myopathy or myelopathic signs, no acute or ongoing radiculopathy however I did discuss with patient if she did have radiculopathy in the past is  because it is not shown on the lumbar imaging right now does not mean that she can have it in the past and at that time she had some radicular nerve entrapment that could be causing residual pain over the years.  Physician gave her some amitriptyline, this is something that we use in chronic pain management and I advised her to follow-up with that physician and pain management.  She has seen neurology in the past who suggested there may be some functional overlay however I am not qualified to comment on that all I know is that I have evaluated her and I really cannot think of any thing else to do.  I did apologize if there is something that we missed and in that case I really did encourage her to go to [facility] or [facility] if she feels as though she does not have any answers and this is affecting her quality of life which she does, we have those wonderful academic institutions and there is no reason she should not utilize those resources.

## 2019-02-11 NOTE — Progress Notes (Signed)
She should not be. It will be fine

## 2019-02-12 NOTE — Progress Notes (Signed)
Called pt no answer, left a vm to return

## 2019-02-13 ENCOUNTER — Telehealth: Payer: Self-pay

## 2019-02-13 NOTE — Telephone Encounter (Signed)
-----   Message from Adrian Prows, MD sent at 02/11/2019  6:30 AM EST ----- She should not be. It will be fine

## 2019-02-15 ENCOUNTER — Encounter (HOSPITAL_COMMUNITY): Payer: Self-pay

## 2019-02-15 ENCOUNTER — Ambulatory Visit (HOSPITAL_COMMUNITY)
Admission: EM | Admit: 2019-02-15 | Discharge: 2019-02-15 | Disposition: A | Discharge status: Home / Self Care | Payer: No Typology Code available for payment source | Attending: Family Medicine | Admitting: Family Medicine

## 2019-02-15 ENCOUNTER — Other Ambulatory Visit: Payer: Self-pay

## 2019-02-15 DIAGNOSIS — N939 Abnormal uterine and vaginal bleeding, unspecified: Secondary | ICD-10-CM | POA: Insufficient documentation

## 2019-02-15 LAB — HEMOGLOBIN AND HEMATOCRIT, BLOOD
HCT: 38.7 % (ref 36.0–46.0)
Hemoglobin: 13.4 g/dL (ref 12.0–15.0)

## 2019-02-15 MED ORDER — MEGESTROL ACETATE 40 MG PO TABS: 40.0000 mg | ORAL_TABLET | Freq: Two times a day (BID) | ORAL | 0 refills | Status: DC

## 2019-02-15 NOTE — ED Provider Notes (Signed)
[place]    CSN: [id number] Arrival date & time: 02/15/19  1007      History   Chief Complaint Chief Complaint  Patient presents with  . Vaginal Bleeding    HPI LAURELI GRONDAHL is a 54 y.o. female.   MANDEEP MATZA presents with complaints of vaginal bleeding which started yesterday. Has been regular, bright red bleeding and has noted some clots. Feels some light headedness but also took an amitriptyline and xanax this morning, which is directed for her to take at night. She felt anxious this morning. No other dizziness. Occasional or slight pelvic discomfort 2/10 in severity. Hasn't taken any medications for her symptoms. She states she is post menopausal. Does follow with gyn, last appointment was 50months ago in which she had her IUD removed. She has regular PAP smears which have been normal. Due to IUD she has not had a period in years, maybe even 10 years. Denies any previous similar episodes of bleeding. No recent falls or any known trauma. No other vaginal symptoms.     ROS per HPI, negative if not otherwise mentioned.      Past Medical History:  Diagnosis Date  . Anxiety   . Chronic pain   . Depression   . Gallstones   . Kidney stones   . Osteopenia   . Peroneal mononeuropathy     Patient Active Problem List   Diagnosis Date Noted  . Multiple neurological symptoms 12/01/2018  . Chronic back pain 11/26/2017  . Microscopic hematuria 11/04/2015    Past Surgical History:  Procedure Laterality Date  . CHOLECYSTECTOMY    . LITHOTRIPSY    . SHOULDER ARTHROSCOPY  2015    OB History   No obstetric history on file.      Home Medications    Prior to Admission medications   Medication Sig Start Date End Date Taking? Authorizing Provider  ALPRAZolam (XANAX) 0.25 MG tablet Take 1-2 tabs (0.25mg -0.50mg ) 30-60 minutes before procedure. May repeat if needed.Do not drive. 12/31/18   Melvenia Beam, MD  amitriptyline (ELAVIL) 10 MG tablet Take 10 mg  by mouth at bedtime as needed.     [provider]  megestrol (MEGACE) 40 MG tablet Take 1 tablet (40 mg total) by mouth 2 (two) times daily for 10 days. 02/15/19 02/25/19  Zigmund Gottron, NP  Multiple Vitamins-Minerals (CENTRUM SILVER 50+WOMEN PO) Take 1 tablet by mouth daily.    [provider]  OVER THE COUNTER MEDICATION Alive Calcium bone formula    [provider]  sertraline (ZOLOFT) 100 MG tablet Take 100 mg by mouth daily. 11/03/18   [provider]  traMADol (ULTRAM) 50 MG tablet Take by mouth every 6 (six) hours as needed.    [provider]    Family History Family History  Problem Relation Age of Onset  . Breast cancer Mother   . Osteoporosis Mother   . Asthma Mother   . Diabetes Mother   . Heart disease Father   . Heart attack Father 16  . Diabetes Other   . Colon cancer Neg Hx   . Colon polyps Neg Hx   . Esophageal cancer Neg Hx   . Stomach cancer Neg Hx   . Rectal cancer Neg Hx     Social History Social History   Tobacco Use  . Smoking status: Former Smoker    Years: 5.00    Types: Cigarettes    Quit date: 01/21/1999  Years since quitting: 20.0  . Smokeless tobacco: Never Used  . Tobacco comment: teenage and college years  Substance Use Topics  . Alcohol use: Not Currently    Comment: no  . Drug use: Never    Comment: no     Allergies   Shellfish allergy   Review of Systems Review of Systems   Physical Exam Triage Vital Signs ED Triage Vitals  Enc Vitals Group     BP 02/15/19 1026 117/78     Pulse Rate 02/15/19 1026 84     Resp 02/15/19 1026 16     Temp 02/15/19 1026 (!) 97.5 F (36.4 C)     Temp Source 02/15/19 1026 Oral     SpO2 02/15/19 1026 98 %     Weight --      Height --      Head Circumference --      Peak Flow --      Pain Score 02/15/19 1027 3     Pain Loc --      Pain Edu? --      Excl. in [place]? --    No data found.  Updated Vital Signs BP 117/78 (BP Location: Left Arm)    Pulse 84   Temp (!) 97.5 F (36.4 C) (Oral)   Resp 16   SpO2 98%    Physical Exam Constitutional:      General: She is not in acute distress.    Appearance: She is well-developed.  Cardiovascular:     Rate and Rhythm: Normal rate.  Pulmonary:     Effort: Pulmonary effort is normal.  Genitourinary:    Vagina: Normal.     Cervix: Cervical bleeding present. No friability.     Uterus: Normal.      Adnexa:        Right: No mass.         Left: No mass.       Comments: Moderate amount of bleeding noted from cervix which appears grossly normal on exam; very mild discomfort with bimanual palpation without reproducible or specific point tenderness Skin:    General: Skin is warm and dry.  Neurological:     Mental Status: She is alert and oriented to person, place, and time.      UC Treatments / Results  Labs (all labs ordered are listed, but only abnormal results are displayed) Labs Reviewed  HEMOGLOBIN AND HEMATOCRIT, BLOOD  CERVICOVAGINAL ANCILLARY ONLY    EKG   Radiology No results found.  Procedures Procedures (including critical care time)  Medications Ordered in UC Medications - No data to display  Initial Impression / Assessment and Plan / UC Course  I have reviewed the triage vital signs and the nursing notes.  Pertinent labs & imaging results that were available during my care of the patient were reviewed by me and considered in my medical decision making (see chart for details).     Post menopausal bleeding in this 54 year old female. Had IUD removed approximately 6 months ago. BP is stable, heart rate stable. Approximately 24 hours of bleeding, less than a pad an hour. H&H collected and pending. Vaginal cytology collected and pending to confirm normal as well. Megace provided, discussed use in stopping bleeding, patient states she may wait and see trend of bleeding over the next few days before starting. Encouraged follow up with gyne for further evaluation  for post menopausal bleeding. Return precautions provided. Patient verbalized understanding and agreeable to plan.  Ambulatory  out of clinic without difficulty.    Final Clinical Impressions(s) / UC Diagnoses   Final diagnoses:  Abnormal uterine bleeding     Discharge Instructions     Exam is reassuring here today.  We will make sure your blood levels are well and you haven't bled too heavily, as well as rule out potential infectious source (low suspicion of this).  Please follow up with gynecology for further evaluation of source of post-menopausal bleeding.  You may start taking megace 40 mg twice a day to stop the bleeding, but if not needing to change pad every hour or bleeding is starting to subside you do not have to take this medication and you can wait until you are seen by gynecology.  Please be seen in the ER if you develop worsening of pain, fevers, faintness/ lightheadedness or bleeding through a pad an hour or less.    ED Prescriptions    Medication Sig Dispense Auth. Provider   megestrol (MEGACE) 40 MG tablet Take 1 tablet (40 mg total) by mouth 2 (two) times daily for 10 days. 20 tablet Zigmund Gottron, NP     PDMP not reviewed this encounter.   Zigmund Gottron, NP 02/15/19 1137

## 2019-02-15 NOTE — ED Triage Notes (Signed)
Pt present vaginal bleeding that started yesterday morning. Pt states she is pass clots of blood.

## 2019-02-15 NOTE — Discharge Instructions (Signed)
Exam is reassuring here today.  We will make sure your blood levels are well and you haven't bled too heavily, as well as rule out potential infectious source (low suspicion of this).  Please follow up with gynecology for further evaluation of source of post-menopausal bleeding.  You may start taking megace 40 mg twice a day to stop the bleeding, but if not needing to change pad every hour or bleeding is starting to subside you do not have to take this medication and you can wait until you are seen by gynecology.  Please be seen in the ER if you develop worsening of pain, fevers, faintness/ lightheadedness or bleeding through a pad an hour or less.

## 2019-02-16 ENCOUNTER — Telehealth: Payer: Self-pay | Admitting: *Deleted

## 2019-02-16 NOTE — Telephone Encounter (Addendum)
Pt left VM message stating that she was recently seen @ [facility] for a Gyn issue and was referred to our office. She wants to know the next steps.  *Per chart review, pt has been scheduled for office visit on 2/19 @ 1055. She has read the appt message in MyChart.

## 2019-02-17 LAB — CERVICOVAGINAL ANCILLARY ONLY
Bacterial vaginitis: NEGATIVE
Candida vaginitis: NEGATIVE
Chlamydia: NEGATIVE
Neisseria Gonorrhea: NEGATIVE
Trichomonas: NEGATIVE

## 2019-02-18 ENCOUNTER — Ambulatory Visit (INDEPENDENT_AMBULATORY_CARE_PROVIDER_SITE_OTHER): Payer: No Typology Code available for payment source

## 2019-02-18 ENCOUNTER — Other Ambulatory Visit: Payer: Self-pay | Admitting: Internal Medicine

## 2019-02-18 DIAGNOSIS — Z1159 Encounter for screening for other viral diseases: Secondary | ICD-10-CM

## 2019-02-18 LAB — SARS CORONAVIRUS 2 (TAT 6-24 HRS): SARS Coronavirus 2: NEGATIVE

## 2019-02-19 ENCOUNTER — Encounter: Payer: Self-pay | Admitting: Cardiology

## 2019-02-19 ENCOUNTER — Other Ambulatory Visit: Payer: Self-pay

## 2019-02-19 ENCOUNTER — Ambulatory Visit (INDEPENDENT_AMBULATORY_CARE_PROVIDER_SITE_OTHER): Payer: No Typology Code available for payment source | Admitting: Cardiology

## 2019-02-19 VITALS — BP 120/71 | HR 87 | Temp 97.2°F | Resp 16 | Ht 66.0 in | Wt 154.0 lb

## 2019-02-19 DIAGNOSIS — R002 Palpitations: Secondary | ICD-10-CM | POA: Diagnosis not present

## 2019-02-19 DIAGNOSIS — R9431 Abnormal electrocardiogram [ECG] [EKG]: Secondary | ICD-10-CM

## 2019-02-19 DIAGNOSIS — R000 Tachycardia, unspecified: Secondary | ICD-10-CM | POA: Diagnosis not present

## 2019-02-19 NOTE — Progress Notes (Signed)
Primary Physician/Referring:  Leanna Battles, MD  Patient ID: Sheena Gray, female    DOB: 1965/10/01, 54 y.o.   MRN: [id number]  Chief Complaint  Patient presents with  . Palpitations    4 wk follow up   HPI:    Sheena Gray  is a 54 y.o. Caucasian female with chronic back pain, extensive evaluation by neurology and recently was evaluated by Dr. Lavell Anchors and diagnosed with nonspecific bilateral peroneal mononeuropathy, family history of sudden cardiac death in her father at age 28 was evaluated at [facility] and no cause found in 1980, no other family members with sudden cardiac death (suspected to be suicide, no CAD), mild hyperlipidemia, tobacco use for 13 years quit in 2016 referred to me for cardiac risk stratification. and palpitations. She underwent event monitor and echo and presents for f/u. No new symptoms.   Past Medical History:  Diagnosis Date  . Anxiety   . Chronic pain   . Common peroneal nerve dysfunction, initial encounter 01/29/2019  . Depression   . Gallstones   . Kidney stones   . Osteopenia   . Peroneal mononeuropathy    Past Surgical History:  Procedure Laterality Date  . CHOLECYSTECTOMY    . LITHOTRIPSY    . SHOULDER ARTHROSCOPY  2015   Social History   Tobacco Use  . Smoking status: Former Smoker    Years: 5.00    Types: Cigarettes    Quit date: 01/21/1999    Years since quitting: 20.0  . Smokeless tobacco: Never Used  . Tobacco comment: teenage and college years  Substance Use Topics  . Alcohol use: Not Currently    Comment: no   ROS  Review of Systems  Constitution: Negative for chills, decreased appetite, malaise/fatigue and weight gain.  Cardiovascular: Positive for palpitations. Negative for dyspnea on exertion, leg swelling and syncope.  Endocrine: Negative for cold intolerance.  Hematologic/Lymphatic: Does not bruise/bleed easily.  Musculoskeletal: Negative for joint swelling.  Gastrointestinal: Negative for abdominal pain,  anorexia, change in bowel habit, hematochezia and melena.  Neurological: Positive for numbness and paresthesias. Negative for headaches and light-headedness.  Psychiatric/Behavioral: Positive for depression. Negative for substance abuse. The patient is nervous/anxious.   All other systems reviewed and are negative.  Objective  Blood pressure 120/71, pulse 87, temperature (!) 97.2 F (36.2 C), temperature source Temporal, resp. rate 16, height 5\' 6"  (1.676 m), weight 154 lb (69.9 kg), SpO2 98 %.  Vitals with BMI 02/19/2019 02/15/2019 02/09/2019  Height 5\' 6"  - 5\' 6"   Weight 154 lbs - 151 lbs  BMI 123456 - [id number]  Systolic 123456 [id number] -  Diastolic 71 78 -  Pulse 87 84 -     Physical Exam  Constitutional:  She is moderately built and well-nourished and appears to be in no acute distress.  HENT:  Head: Atraumatic.  Eyes: Conjunctivae are normal.  Neck: No JVD present. No thyromegaly present.  Cardiovascular: Normal rate, regular rhythm, normal heart sounds and intact distal pulses. Exam reveals no gallop.  No murmur heard. No leg edema, no JVD.  Pulmonary/Chest: Effort normal and breath sounds normal.  Abdominal: Soft. Bowel sounds are normal.  Musculoskeletal:        General: Normal range of motion.     Cervical back: Neck supple.  Neurological: She is alert.  Skin: Skin is warm and dry.  Psychiatric: She has a normal mood and affect.   Laboratory examination:   No results for input(s): NA, K, CL,  CO2, GLUCOSE, BUN, CREATININE, CALCIUM, GFRNONAA, GFRAA in the last 8760 hours. CrCl cannot be calculated (Patient's most recent lab result is older than the maximum 21 days allowed.).  CMP Latest Ref Rng & Units 01/01/2019 11/11/2010  Glucose 70 - 99 mg/dL - 98  BUN 6 - 23 mg/dL - 19  Creatinine 0.50 - 1.10 mg/dL - 0.88  Sodium 135 - 145 mEq/L - 135  Potassium 3.5 - 5.1 mEq/L - 3.4(L)  Chloride 96 - 112 mEq/L - 100  CO2 19 - 32 mEq/L - 23  Calcium 8.4 - 10.5 mg/dL - 10.2  Total Protein  6.0 - 8.5 g/dL 7.6 -   CBC Latest Ref Rng & Units 02/15/2019 11/11/2010  WBC 4.0 - 10.5 K/uL - 20.2(H)  Hemoglobin 12.0 - 15.0 g/dL 13.4 14.4  Hematocrit 36.0 - 46.0 % 38.7 41.4  Platelets 150 - 400 K/uL - 216   Lipid Panel  No results found for: CHOL, TRIG, HDL, CHOLHDL, VLDL, LDLCALC, LDLDIRECT HEMOGLOBIN A1C No results found for: HGBA1C, MPG TSH No results for input(s): TSH in the last 8760 hours.  Labs 11/18/2018: B12 normal, myasthenia gravis panel negative, CRP 2.0 mg, total CK 168, magnesium 2.1. Medications and allergies   Allergies  Allergen Reactions  . Shellfish Allergy Nausea And Vomiting     Current Outpatient Medications  Medication Instructions  . ALPRAZolam (XANAX) 0.25 MG tablet Take 1-2 tabs (0.25mg -0.50mg ) 30-60 minutes before procedure. May repeat if needed.Do not drive.  Marland Kitchen amitriptyline (ELAVIL) 25 mg, Oral, At bedtime PRN  . Multiple Vitamins-Minerals (CENTRUM SILVER 50+WOMEN PO) 1 tablet, Oral, Daily  . OVER THE COUNTER MEDICATION Alive Calcium bone formula   . sertraline (ZOLOFT) 100 mg, Oral, Daily  . traMADol (ULTRAM) 50 MG tablet Oral, Every 6 hours PRN   Radiology:  No results found.  Cardiac Studies:   Event Monitor 01/21/2019 through 02/03/2019:  There was 9 beat asymptomatic NSVT at 9:20AM on 01/31/2019.  No atrial fibrillation. No heart block.   Echocardiogram 02/04/2019:  Left ventricle cavity is normal in size and wall thickness. Normal global  wall motion. Normal LV systolic function with EF 60%. Normal diastolic  filling pattern.  Mild (Grade I) mitral regurgitation.  Mild tricuspid regurgitation. Estimated pulmonary artery systolic pressure  is 25 mmHg.  Assessment     ICD-10-CM   1. Shortened PR interval  R94.31   2. Palpitations  R00.2   3. Tachycardia  R00.0     EKG 01/20/2018: Sinus rhythm with short PR interval, PR interval is 108-110 ms, normal axis, incomplete right bundle branch block.  No evidence of ischemia, normal  QT interval.     Recommendations:  No orders of the defined types were placed in this encounter.   Sheena Gray  is a 54 y.o. Caucasian female with chronic back pain, extensive evaluation by neurology and recently was evaluated by Dr. Lavell Anchors and diagnosed with nonspecific bilateral peroneal mononeuropathy, family history of sudden cardiac death in her father at age 64 was evaluated at [facility] and no cause found in 1980, no other family members with sudden cardiac death (suspected to be suicide, no CAD), mild hyperlipidemia, tobacco use for 13 years quit in 2016 referred to me for cardiac risk stratification. and palpitations. She essentially wanted to make sure her symptoms of palpitations and leg disesthesia are not related to cardiac and vascular issues.  I reviewed her echocardiogram and event monitor.  Although she has short PR interval, no PSVT, asymptomatic NSVT, this  occurred when she was having an argument with her son, and she was very excited.  Extensive discussion again that this could be an incidental finding however in view of her father's history of sudden cardiac death who was very healthy otherwise would recommend genetic screening for arrhythmic etiology for sudden cardiac death.  I do not think she needs stress test, she exercises heavily on a regular basis without any chest pain or shortness of breath.  Patient is willing to undergo genetic testing, risks and benefits extensively discussed with the patient including privacy issues.  Unless this is abnormal, I will see her back on a as needed basis.  In view of short PR interval I did discuss with her regarding possibility of PSVT in the future and also Valsalva strain: I have explained to the patient the mechanism of PSVT, explained Valsalva maneuver, straining, splashing cold water on the face.  Patient also advised to go to the nearest medical facility to obtain a EKG to confirm presence of PSVT or activate EMS.  Also discussed  if the usual maneuvers do not improve the symptoms to go to the emergency department.   I will see her back on a prn basis. "Total time spent with patient was  40 minutes regarding her medical issue discussion, counseling.  Adrian Prows, MD, [facility] 02/19/2019, 1:55 PM [facility] [place] Office: [phone]  CC: Sarina Ill, MD Lewie Chamber) and Janie Morning, MD (PCP)

## 2019-02-21 ENCOUNTER — Encounter: Payer: Self-pay | Admitting: Cardiology

## 2019-02-23 ENCOUNTER — Other Ambulatory Visit: Payer: Self-pay

## 2019-02-23 ENCOUNTER — Encounter: Payer: Self-pay | Admitting: Internal Medicine

## 2019-02-23 ENCOUNTER — Ambulatory Visit (AMBULATORY_SURGERY_CENTER): Payer: No Typology Code available for payment source | Admitting: Internal Medicine

## 2019-02-23 VITALS — BP 110/71 | HR 69 | Temp 96.8°F | Resp 14 | Ht 66.0 in | Wt 151.0 lb

## 2019-02-23 DIAGNOSIS — D125 Benign neoplasm of sigmoid colon: Secondary | ICD-10-CM

## 2019-02-23 DIAGNOSIS — K635 Polyp of colon: Secondary | ICD-10-CM | POA: Diagnosis not present

## 2019-02-23 DIAGNOSIS — Z1211 Encounter for screening for malignant neoplasm of colon: Secondary | ICD-10-CM

## 2019-02-23 DIAGNOSIS — D122 Benign neoplasm of ascending colon: Secondary | ICD-10-CM

## 2019-02-23 MED ORDER — SODIUM CHLORIDE 0.9 % IV SOLN: 500.0000 mL | Freq: Once | INTRAVENOUS | Status: DC

## 2019-02-23 NOTE — Progress Notes (Signed)
PT taken to PACU. Monitors in place. VSS. Report given to RN. 

## 2019-02-23 NOTE — Patient Instructions (Signed)
Please read handouts provided. Continue present medications. Await pathology results.        YOU HAD AN ENDOSCOPIC PROCEDURE TODAY AT THE [facility]:   Refer to the procedure report that was given to you for any specific questions about what was found during the examination.  If the procedure report does not answer your questions, please call your gastroenterologist to clarify.  If you requested that your care partner not be given the details of your procedure findings, then the procedure report has been included in a sealed envelope for you to review at your convenience later.  YOU SHOULD EXPECT: Some feelings of bloating in the abdomen. Passage of more gas than usual.  Walking can help get rid of the air that was put into your GI tract during the procedure and reduce the bloating. If you had a lower endoscopy (such as a colonoscopy or flexible sigmoidoscopy) you may notice spotting of blood in your stool or on the toilet paper. If you underwent a bowel prep for your procedure, you may not have a normal bowel movement for a few days.  Please Note:  You might notice some irritation and congestion in your nose or some drainage.  This is from the oxygen used during your procedure.  There is no need for concern and it should clear up in a day or so.  SYMPTOMS TO REPORT IMMEDIATELY:   Following lower endoscopy (colonoscopy or flexible sigmoidoscopy):  Excessive amounts of blood in the stool  Significant tenderness or worsening of abdominal pains  Swelling of the abdomen that is new, acute  Fever of 100F or higher    For urgent or emergent issues, a gastroenterologist can be reached at any hour by calling [phone].   DIET:  We do recommend a small meal at first, but then you may proceed to your regular diet.  Drink plenty of fluids but you should avoid alcoholic beverages for 24 hours.  ACTIVITY:  You should plan to take it easy for the rest of today and you should NOT  DRIVE or use heavy machinery until tomorrow (because of the sedation medicines used during the test).    FOLLOW UP: Our staff will call the number listed on your records 48-72 hours following your procedure to check on you and address any questions or concerns that you may have regarding the information given to you following your procedure. If we do not reach you, we will leave a message.  We will attempt to reach you two times.  During this call, we will ask if you have developed any symptoms of COVID 19. If you develop any symptoms (ie: fever, flu-like symptoms, shortness of breath, cough etc.) before then, please call [phone].  If you test positive for Covid 19 in the 2 weeks post procedure, please call and report this information to us.    If any biopsies were taken you will be contacted by phone or by letter within the next 1-3 weeks.  Please call us at [phone] if you have not heard about the biopsies in 3 weeks.    SIGNATURES/CONFIDENTIALITY: You and/or your care partner have signed paperwork which will be entered into your electronic medical record.  These signatures attest to the fact that that the information above on your After Visit Summary has been reviewed and is understood.  Full responsibility of the confidentiality of this discharge information lies with you and/or your care-partner. 

## 2019-02-23 NOTE — Progress Notes (Signed)
Called to room to assist during endoscopic procedure.  Patient ID and intended procedure confirmed with present staff. Received instructions for my participation in the procedure from the performing physician.  

## 2019-02-23 NOTE — Op Note (Signed)
[facility] Patient Name: Sheena Gray Procedure Date: 02/23/2019 8:07 AM MRN: [id number] Endoscopist: Jerene Bears , MD Age: 54 Referring MD:  Date of Birth: 06-09-1965 Gender: Female Account #: [id number] Procedure:                Colonoscopy Indications:              Screening for colorectal malignant neoplasm, This                            is the patient's first colonoscopy Medicines:                Propofol per Anesthesia Procedure:                Pre-Anesthesia Assessment:                           - Prior to the procedure, a History and Physical                            was performed, and patient medications and                            allergies were reviewed. The patient's tolerance of                            previous anesthesia was also reviewed. The risks                            and benefits of the procedure and the sedation                            options and risks were discussed with the patient.                            All questions were answered, and informed consent                            was obtained. Prior Anticoagulants: The patient has                            taken no previous anticoagulant or antiplatelet                            agents. ASA Grade Assessment: II - A patient with                            mild systemic disease. After reviewing the risks                            and benefits, the patient was deemed in                            satisfactory condition to undergo the procedure.  After obtaining informed consent, the colonoscope                            was passed under direct vision. Throughout the                            procedure, the patient's blood pressure, pulse, and                            oxygen saturations were monitored continuously. The                            Colonoscope was introduced through the anus and                            advanced to the cecum, identified  by appendiceal                            orifice and ileocecal valve. The colonoscopy was                            performed without difficulty. The patient tolerated                            the procedure well. The quality of the bowel                            preparation was good. The ileocecal valve,                            appendiceal orifice, and rectum were photographed. Scope In: 8:12:29 AM Scope Out: 8:34:45 AM Scope Withdrawal Time: 0 hours 15 minutes 46 seconds  Total Procedure Duration: 0 hours 22 minutes 16 seconds  Findings:                 The digital rectal exam was normal.                           A 8 mm polyp was found in the ascending colon. The                            polyp was sessile. The polyp was removed with a                            cold snare. Resection and retrieval were complete.                           A 3 mm polyp was found in the distal sigmoid colon.                            The polyp was sessile. The polyp was removed with a  cold snare. Resection and retrieval were complete.                           A few small-mouthed diverticula were found in the                            sigmoid colon.                           Internal hemorrhoids were found during                            retroflexion. The hemorrhoids were small. Complications:            No immediate complications. Estimated Blood Loss:     Estimated blood loss was minimal. Impression:               - One 8 mm polyp in the ascending colon, removed                            with a cold snare. Resected and retrieved.                           - One 3 mm polyp in the distal sigmoid colon,                            removed with a cold snare. Resected and retrieved.                           - Diverticulosis in the sigmoid colon.                           - Small hemorrhoids. Recommendation:           - Patient has a contact number available for                             emergencies. The signs and symptoms of potential                            delayed complications were discussed with the                            patient. Return to normal activities tomorrow.                            Written discharge instructions were provided to the                            patient.                           - Resume previous diet.                           - Continue present medications.                           -  Await pathology results.                           - Repeat colonoscopy is recommended for                            surveillance. The colonoscopy date will be                            determined after pathology results from today's                            exam become available for review. Jerene Bears, MD 02/23/2019 8:38:07 AM This report has been signed electronically.

## 2019-02-23 NOTE — Progress Notes (Signed)
Pt's states no medical or surgical changes since previsit or office visit.  Temp JB VS DT  

## 2019-02-25 ENCOUNTER — Telehealth: Payer: Self-pay

## 2019-02-25 NOTE — Telephone Encounter (Signed)
  Follow up Call-  Call back number 02/23/2019  Post procedure Call Back phone  # [phone]  Permission to leave phone message Yes  Some recent data might be hidden     Patient questions:  Do you have a fever, pain , or abdominal swelling? No. Pain Score  0 *  Have you tolerated food without any problems? Yes.    Have you been able to return to your normal activities? Yes.    Do you have any questions about your discharge instructions: Diet   No. Medications  No. Follow up visit  No.  Do you have questions or concerns about your Care? No.  Actions: * If pain score is 4 or above: No action needed, pain <4.  1. Have you developed a fever since your procedure? no  2.   Have you had an respiratory symptoms (SOB or cough) since your procedure? no  3.   Have you tested positive for COVID 19 since your procedure no  4.   Have you had any family members/close contacts diagnosed with the COVID 19 since your procedure?  no   If yes to any of these questions please route to Joylene John, RN and Alphonsa Gin, Therapist, sports.

## 2019-02-26 ENCOUNTER — Encounter: Payer: Self-pay | Admitting: Internal Medicine

## 2019-03-04 ENCOUNTER — Other Ambulatory Visit: Payer: Self-pay

## 2019-03-04 ENCOUNTER — Ambulatory Visit: Payer: No Typology Code available for payment source | Admitting: Cardiology

## 2019-03-04 DIAGNOSIS — Z8241 Family history of sudden cardiac death: Secondary | ICD-10-CM

## 2019-03-04 NOTE — Progress Notes (Signed)
Patient here for genetic testing lab draw only. Specimen taken from L AC. Patient tolerated well. No complications

## 2019-03-06 ENCOUNTER — Encounter: Payer: Self-pay | Admitting: Obstetrics & Gynecology

## 2019-03-06 ENCOUNTER — Ambulatory Visit (INDEPENDENT_AMBULATORY_CARE_PROVIDER_SITE_OTHER): Payer: No Typology Code available for payment source | Admitting: Obstetrics & Gynecology

## 2019-03-06 ENCOUNTER — Other Ambulatory Visit: Payer: Self-pay

## 2019-03-06 ENCOUNTER — Telehealth: Payer: Self-pay | Admitting: Obstetrics & Gynecology

## 2019-03-06 DIAGNOSIS — N924 Excessive bleeding in the premenopausal period: Secondary | ICD-10-CM | POA: Diagnosis not present

## 2019-03-06 NOTE — Patient Instructions (Signed)

## 2019-03-06 NOTE — Progress Notes (Signed)
Patient ID: Sheena Gray, female   DOB: 11/01/1965, 54 y.o.   MRN: [id number]  Chief Complaint  Patient presents with  . Gynecologic Exam  recent heavy bleeding  HPI Sheena Gray is a 54 y.o. female.  [id number] No LMP recorded. Patient is perimenopausal. She had vaginal bleeding late January that was heavy lasting 3-4 days. She had Mirena in place with no vaginal bleeding for 10 years. This was removed by Dr. Gaetano Net 6-7 months ago. No bleeding after that until January.  HPI  Past Medical History:  Diagnosis Date  . Anxiety   . Chronic pain   . Common peroneal nerve dysfunction, initial encounter 01/29/2019  . Depression   . Gallstones   . Kidney stones   . Osteopenia   . Peroneal mononeuropathy     Past Surgical History:  Procedure Laterality Date  . CHOLECYSTECTOMY    . LITHOTRIPSY    . SHOULDER ARTHROSCOPY  2015    Family History  Problem Relation Age of Onset  . Breast cancer Mother   . Osteoporosis Mother   . Asthma Mother   . Diabetes Mother   . Heart disease Father   . Heart attack Father 64  . Diabetes Other   . Colon cancer Neg Hx   . Colon polyps Neg Hx   . Esophageal cancer Neg Hx   . Stomach cancer Neg Hx   . Rectal cancer Neg Hx     Social History Social History   Tobacco Use  . Smoking status: Former Smoker    Years: 5.00    Types: Cigarettes    Quit date: 01/21/1999    Years since quitting: 20.1  . Smokeless tobacco: Never Used  . Tobacco comment: teenage and college years  Substance Use Topics  . Alcohol use: Not Currently    Comment: no  . Drug use: Never    Comment: no    Allergies  Allergen Reactions  . Shellfish Allergy Nausea And Vomiting    Current Outpatient Medications  Medication Sig Dispense Refill  . amitriptyline (ELAVIL) 10 MG tablet Take 25 mg by mouth at bedtime as needed.     . Multiple Vitamins-Minerals (CENTRUM SILVER 50+WOMEN PO) Take 1 tablet by mouth daily.    Marland Kitchen OVER THE COUNTER MEDICATION Alive Calcium bone  formula    . sertraline (ZOLOFT) 100 MG tablet Take 100 mg by mouth daily.    . traMADol (ULTRAM) 50 MG tablet Take by mouth every 6 (six) hours as needed.    . ALPRAZolam (XANAX) 0.25 MG tablet Take 1-2 tabs (0.25mg -0.50mg ) 30-60 minutes before procedure. May repeat if needed.Do not drive. (Patient not taking: Reported on 03/06/2019) 4 tablet 0   No current facility-administered medications for this visit.    Review of Systems Review of Systems  Constitutional: Negative.   Respiratory: Negative.   Gastrointestinal: Negative.   Endocrine:       Some VMS  Genitourinary: Positive for menstrual problem and vaginal discharge. Negative for vaginal bleeding.    Blood pressure 108/73, pulse 76, height 5\' 6"  (1.676 m), weight 154 lb 14.4 oz (70.3 kg).  Physical Exam Physical Exam Vitals and nursing note reviewed. Exam conducted with a chaperone present.  Constitutional:      Appearance: Normal appearance.  Pulmonary:     Effort: Pulmonary effort is normal.  Abdominal:     General: Abdomen is flat.     Palpations: Abdomen is soft.  Genitourinary:    General: Normal vulva.  Vagina: No vaginal discharge.     Comments: Pelvic exam: normal external genitalia, vulva, vagina, cervix, uterus and adnexa.  Neurological:     Mental Status: She is alert.     Data Reviewed Pap 2016 CBC    Component Value Date/Time   WBC 20.2 (H) 11/11/2010 1730   RBC 4.46 11/11/2010 1730   HGB 13.4 02/15/2019 1058   HCT 38.7 02/15/2019 1058   PLT 216 11/11/2010 1730   MCV 92.8 11/11/2010 1730   MCH 32.3 11/11/2010 1730   MCHC 34.8 11/11/2010 1730   RDW 13.0 11/11/2010 1730   LYMPHSABS 2.3 11/11/2010 1730   MONOABS 1.4 (H) 11/11/2010 1730   EOSABS 0.1 11/11/2010 1730   BASOSABS 0.1 11/11/2010 1730     Assessment Perimenopausal bleeding, normal pelvic exam States she is up to date with pap, breast exam, mammograms  She may not have bled for years due to Mirena in place which was  removed Plan Evaluate for perimenopause with FSH, pelvic US RTC after studies. Consider EMbx if thickened endometrium    Emeterio Reeve 03/06/2019, 12:16 PM

## 2019-03-06 NOTE — Telephone Encounter (Signed)
Verified the insurance via telephone. Reference number for the call [id number]

## 2019-03-07 LAB — FOLLICLE STIMULATING HORMONE: FSH: 9.4 m[IU]/mL

## 2019-03-10 ENCOUNTER — Telehealth (INDEPENDENT_AMBULATORY_CARE_PROVIDER_SITE_OTHER): Payer: No Typology Code available for payment source | Admitting: General Practice

## 2019-03-10 DIAGNOSIS — N924 Excessive bleeding in the premenopausal period: Secondary | ICD-10-CM

## 2019-03-10 NOTE — Telephone Encounter (Signed)
Patient called and left message on nurse voicemail line stating she is a new patient of Dr Jordan Hawks. Patient states she has an ultrasound scheduled for later in the week, but a couple days ago she started to bleed heavy and has been bleeding heavy since Sunday.   Called patient stating I am returning her phone call. Discussed Dr Roselie Awkward is back in the office tomorrow so I will speak with him and see what he recommends. Patient verbalized understanding.

## 2019-03-11 MED ORDER — MEGESTROL ACETATE 40 MG PO TABS: 40.0000 mg | ORAL_TABLET | Freq: Two times a day (BID) | ORAL | 0 refills | Status: DC

## 2019-03-11 NOTE — Telephone Encounter (Signed)
Spoke with Dr Roselie Awkward who states patient can have Megace 40mg  BID for bleeding.   Called patient, no answer- left message to call us back regarding a prescription.

## 2019-03-11 NOTE — Telephone Encounter (Signed)
Patient called and left message on nurse voicemail line stating she is returning my phone call.  Called patient and discussed medication with her. Patient verbalized understanding and states she wonders if she shouldn't take it because she feels like she is bleeding for a reason. Discussed with patient the ultrasound will let Dr Roselie Awkward know if anything potentially concerning is going on. Discussed she is having bleeding because the inner lining of her uterus is building up which is what happens with a period. Discussed the medication helps to keep the lining thin so that nothing builds up to come out. Patient verbalized understanding.

## 2019-03-12 ENCOUNTER — Other Ambulatory Visit: Payer: Self-pay

## 2019-03-12 ENCOUNTER — Ambulatory Visit (HOSPITAL_COMMUNITY)
Admission: RE | Admit: 2019-03-12 | Discharge: 2019-03-12 | Disposition: A | Discharge status: Home / Self Care | Payer: No Typology Code available for payment source | Source: Ambulatory Visit | Attending: Obstetrics & Gynecology | Admitting: Obstetrics & Gynecology

## 2019-03-12 DIAGNOSIS — N924 Excessive bleeding in the premenopausal period: Secondary | ICD-10-CM | POA: Diagnosis present

## 2019-03-25 ENCOUNTER — Telehealth: Payer: Self-pay | Admitting: *Deleted

## 2019-03-25 NOTE — Telephone Encounter (Signed)
Per chart review Dr.Arnold's plan was for fu after [place] and labs done.   I called [facility] and left a message I was returning her call and you do need a follow up appointment with Dr.Arnold, to discuss your concerns-  I will have registars contact you with an appointment. If you still need to talk to [place], please call back. Will also send mychart message. Jacques Navy

## 2019-03-25 NOTE — Telephone Encounter (Signed)
Sheena Gray called and left a voice message she is a patient of Dr.Arnold and is calling re: her latest results. States she thinks she is reading it as she in not in menopause . She thinks she needs to discuss birth control , maybe IUD with Dr. [name]. Sheena Westenberger,RN

## 2019-03-26 ENCOUNTER — Telehealth: Payer: Self-pay | Admitting: Obstetrics & Gynecology

## 2019-03-26 NOTE — Telephone Encounter (Signed)
Spoke with [name] about her appointment with Dr. Roselie Awkward. She stated she would like to get an IUD at the visit.

## 2019-05-04 ENCOUNTER — Encounter: Payer: Self-pay | Admitting: Obstetrics & Gynecology

## 2019-05-04 ENCOUNTER — Other Ambulatory Visit: Payer: Self-pay

## 2019-05-04 ENCOUNTER — Ambulatory Visit (INDEPENDENT_AMBULATORY_CARE_PROVIDER_SITE_OTHER): Payer: No Typology Code available for payment source | Admitting: Obstetrics & Gynecology

## 2019-05-04 VITALS — BP 121/77 | HR 76 | Wt 154.0 lb

## 2019-05-04 DIAGNOSIS — N924 Excessive bleeding in the premenopausal period: Secondary | ICD-10-CM

## 2019-05-04 NOTE — Patient Instructions (Signed)

## 2019-05-04 NOTE — Progress Notes (Signed)
  Subjective:     Patient ID: CAROYL BYLER, female   DOB: 1965/05/16, 54 y.o.   MRN: [id number] Cc: f/u after [place] and [employer] HPI [id number] No LMP recorded. Patient is perimenopausal. No vaginal bleeding since she was last in the office and the [place] was completely normal. We discussed at length whether she would benefit from an IUD for contraception.   Review of Systems  Constitutional: Negative.   Respiratory: Negative.   Endocrine: Negative.   Genitourinary: Negative.        Objective:   Physical Exam Constitutional:      Appearance: Normal appearance.  Pulmonary:     Effort: Pulmonary effort is normal.  Neurological:     Mental Status: She is alert.  Psychiatric:        Mood and Affect: Mood normal.       CLINICAL DATA:  Patient with Peri menopausal bleeding.  EXAM: TRANSABDOMINAL AND TRANSVAGINAL ULTRASOUND OF PELVIS  TECHNIQUE: Both transabdominal and transvaginal ultrasound examinations of the pelvis were performed. Transabdominal technique was performed for global imaging of the pelvis including uterus, ovaries, adnexal regions, and pelvic cul-de-sac. It was necessary to proceed with endovaginal exam following the transabdominal exam to visualize the endometrium.  COMPARISON:  None  FINDINGS: Uterus  Measurements: 7.0 x 3.4 x 4.4 cm = volume: 54.3 mL. No fibroids or other mass visualized.  Endometrium  Thickness: 3 mm.  No focal abnormality visualized.  Right ovary  Measurements: 2.3 x 0.8 x 1.6 cm = volume: 1.5 mL. Normal appearance/no adnexal mass.  Left ovary  Measurements: 2.2 x 1.3 x 1.9 cm = volume: 2.9 mL. Normal appearance/no adnexal mass.  Other findings  No abnormal free fluid.  IMPRESSION: Endometrium measures 3 mm. If bleeding remains unresponsive to hormonal or medical therapy, sonohysterogram should be considered for focal lesion work-up. (Ref: Radiological Reasoning: Algorithmic Workup of Abnormal Vaginal Bleeding  with Endovaginal Sonography and Sonohysterography. AJR [id number])   Electronically Signed   By: Lovey Newcomer M.D.   On: 03/12/2019 11:09 Results for SERYNA, GURA (MRN [id number]) as of 05/04/2019 13:55  Ref. Range 03/06/2019 12:33  [place] Latest Units: mIU/mL 9.4   Assessment:     Perimenopause, normal endometrium no need for biopsy Low pregnancy risk, very little contraceptive benefit from IUD but she may return to have one placed if desired    Plan:     RTC for IUD if desired O/W routine gyn f/u in 6 months  Woodroe Mode, MD 05/04/2019

## 2019-05-13 ENCOUNTER — Ambulatory Visit: Payer: No Typology Code available for payment source | Admitting: Obstetrics & Gynecology

## 2019-12-21 ENCOUNTER — Ambulatory Visit: Payer: No Typology Code available for payment source | Admitting: Family Medicine

## 2020-04-07 IMAGING — US US PELVIS COMPLETE WITH TRANSVAGINAL
1 series · 15 of 25 positions shown · non-contrast
Comparison: None

CLINICAL DATA: Patient with Peri menopausal bleeding.



[Series 1: us pelvis complete with transvaginal · 15 of 69 slices shown]
[im 1/69]
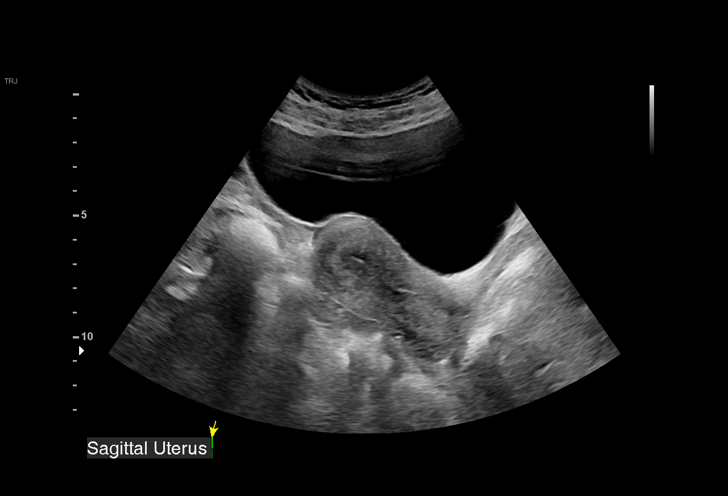
[im 6/69]
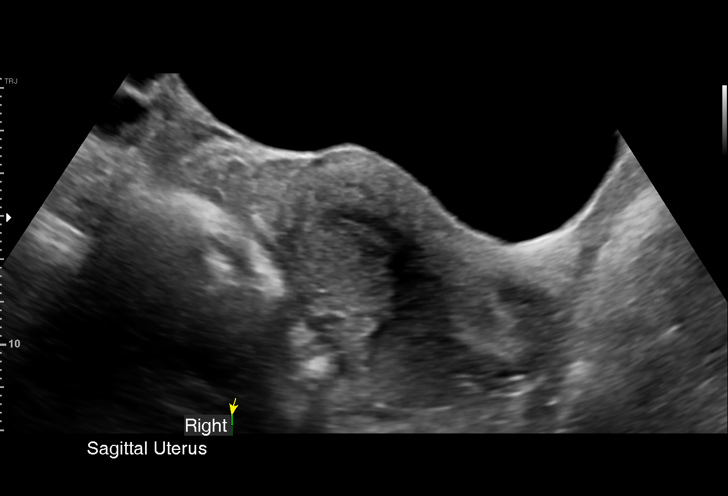
[im 12/69]
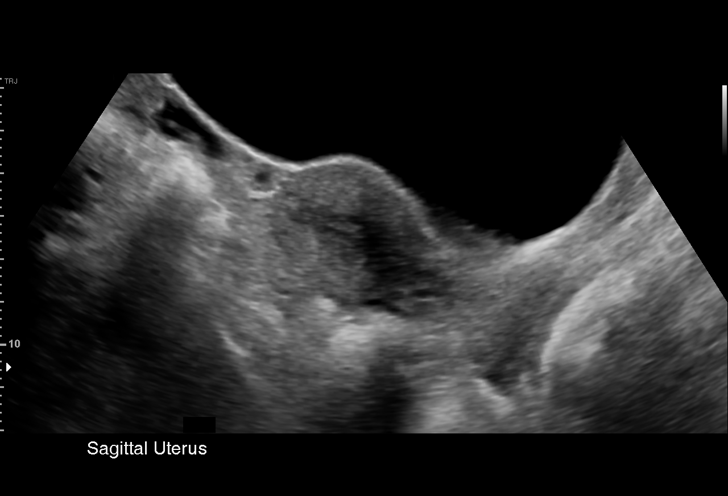
[im 15/69]
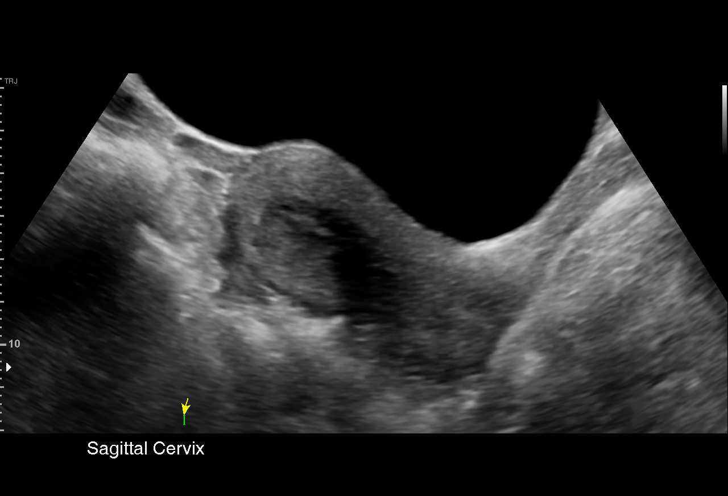
[im 20/69]
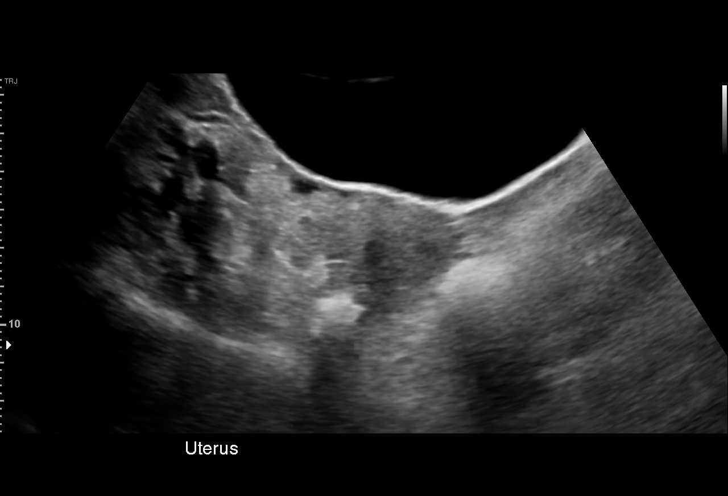
[im 26/69]
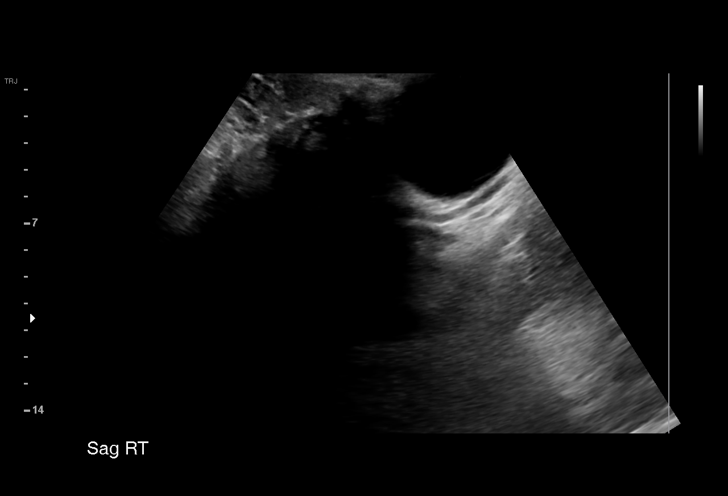
[im 29/69]
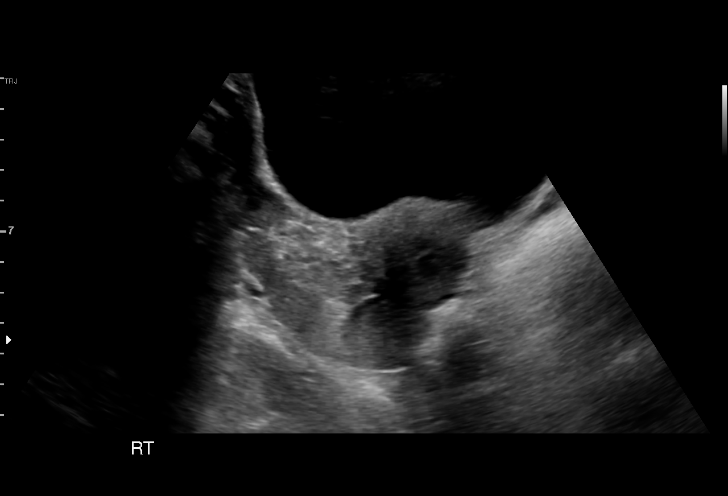
[im 35/69]
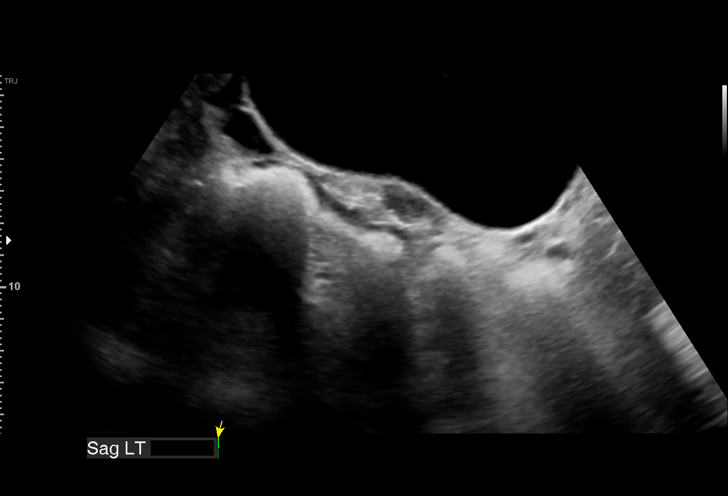
[im 40/69]
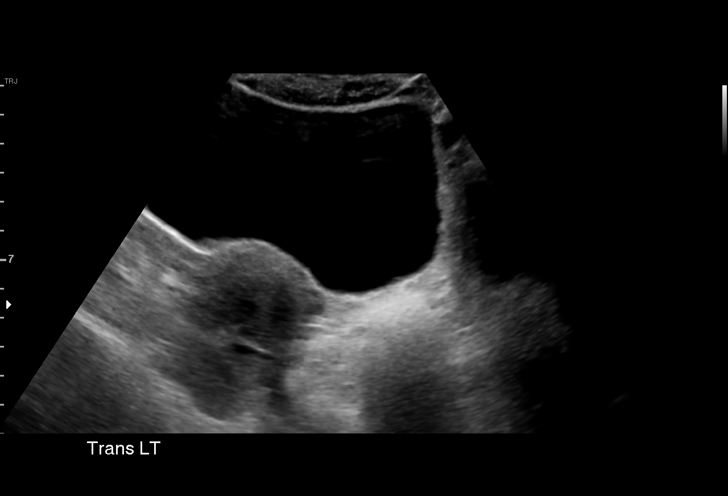
[im 43/69]
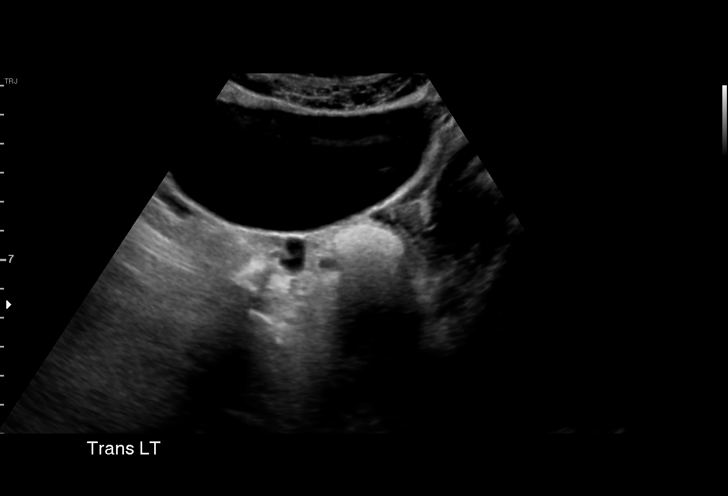
[im 49/69]
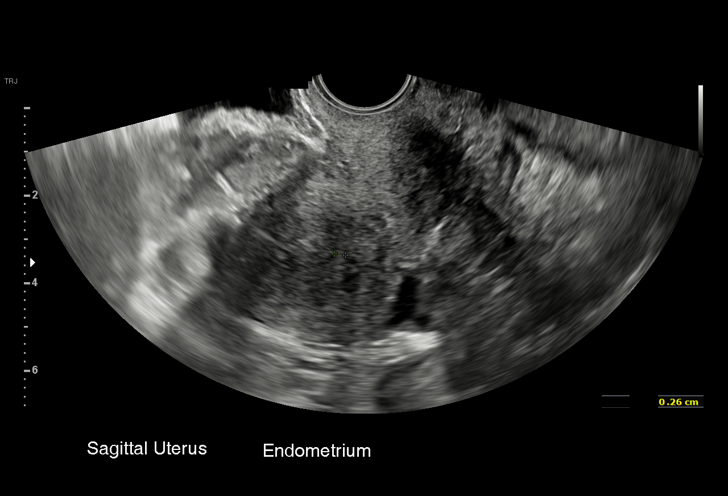
[im 54/69]
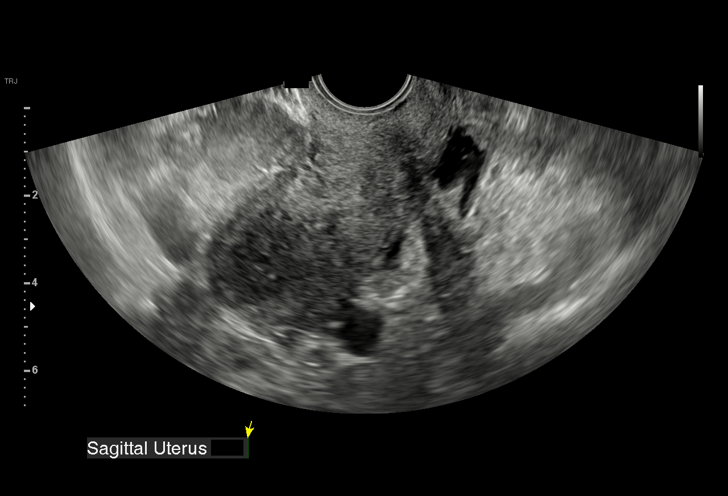
[im 57/69]
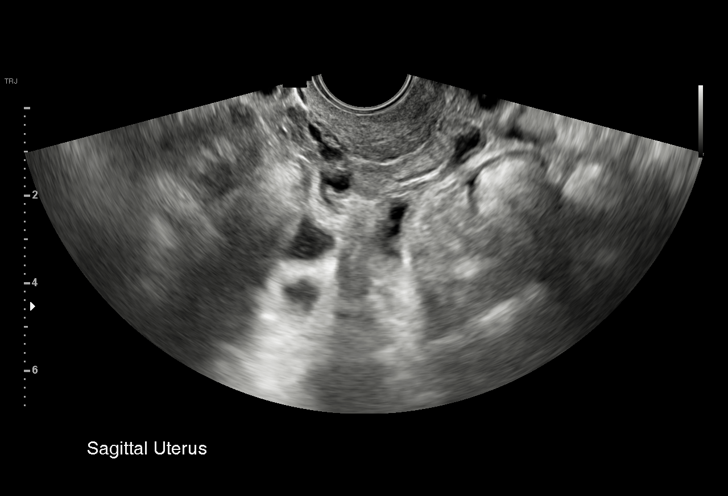
[im 63/69]
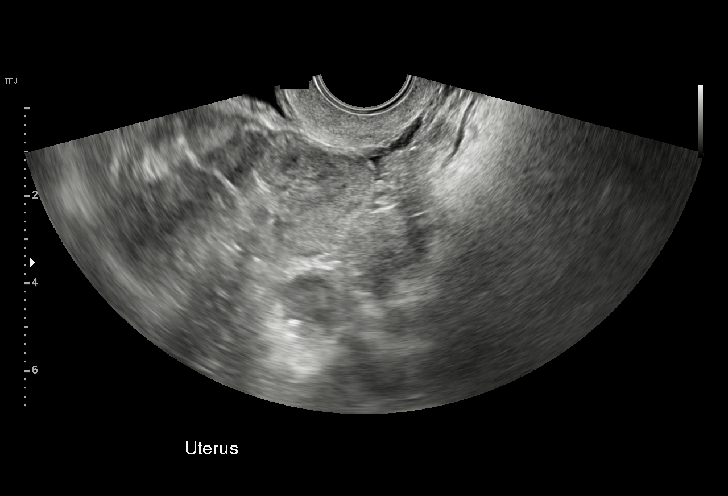
[im 69/69]
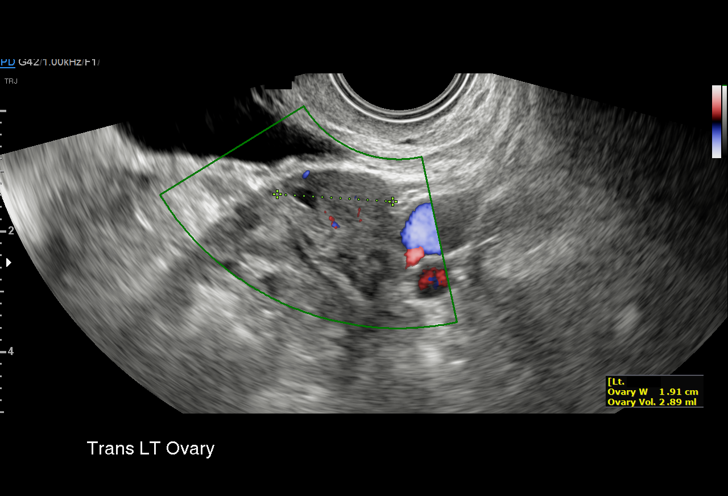

[15 of 25 positions shown; findings below may reference images not displayed]

FINDINGS: Uterus

Measurements: 7.0 x 3.4 x 4.4 cm = volume: 54.3 mL. No fibroids or
other mass visualized.

Endometrium

Thickness: 3 mm.  No focal abnormality visualized.

Right ovary

Measurements: 2.3 x 0.8 x 1.6 cm = volume: 1.5 mL. Normal
appearance/no adnexal mass.

Left ovary

Measurements: 2.2 x 1.3 x 1.9 cm = volume: 2.9 mL. Normal
appearance/no adnexal mass.

Other findings

No abnormal free fluid.
IMPRESSION: Endometrium measures 3 mm. If bleeding remains unresponsive to
hormonal or medical therapy, sonohysterogram should be considered
for focal lesion work-up. (Ref: Radiological Reasoning: Algorithmic
Workup of Abnormal Vaginal Bleeding with Endovaginal Sonography and
Sonohysterography. AJR [id number]; 191:[id number])

## 2022-02-01 ENCOUNTER — Other Ambulatory Visit: Payer: Self-pay | Admitting: Radiology

## 2022-02-01 DIAGNOSIS — M79605 Pain in left leg: Secondary | ICD-10-CM

## 2022-02-01 DIAGNOSIS — M79604 Pain in right leg: Secondary | ICD-10-CM

## 2022-02-07 ENCOUNTER — Ambulatory Visit (INDEPENDENT_AMBULATORY_CARE_PROVIDER_SITE_OTHER): Payer: Self-pay | Admitting: Physical Medicine and Rehabilitation

## 2022-02-07 DIAGNOSIS — M79605 Pain in left leg: Secondary | ICD-10-CM | POA: Diagnosis not present

## 2022-02-07 DIAGNOSIS — R531 Weakness: Secondary | ICD-10-CM | POA: Diagnosis not present

## 2022-02-07 DIAGNOSIS — R202 Paresthesia of skin: Secondary | ICD-10-CM

## 2022-02-07 DIAGNOSIS — M79604 Pain in right leg: Secondary | ICD-10-CM

## 2022-02-07 NOTE — Progress Notes (Signed)
Functional Pain Scale - descriptive words and definitions  No Pain (0)   No Pain/Loss of function  Average Pain 0  Constant numbness and tingling from the knees to the feet, 2nd and 3rd digit on right foot has constant tingling

## 2022-02-08 NOTE — Procedures (Signed)
EMG & NCV Findings: **Please note that the sensory nerve conduction label right sural nerve was in fact the right superficial fibular nerve and was mislabeled Evaluation of the left fibular motor, the right fibular motor, and the right sural sensory nerves showed reduced amplitude (L0.1, R0.5, R1.5 V).  All remaining nerves (as indicated in the following tables) were within normal limits.  Left vs. Right side comparison data for the fibular motor nerve indicates abnormal L-R latency difference (1.7 ms) and abnormal L-R amplitude difference (80.0 %).    All examined muscles (as indicated in the following table) showed no evidence of electrical instability.    Impression: The above electrodiagnostic is ABNORMAL but somewhat difficult to interpret.  The target muscle for measuring peroneal/fibular nerve conductions and complex motor action potentials is the EDB.  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  This tends to represent a Catch-22 as trying to measure the peroneal/fibular nerve relies on the EDB muscle which would in turn rely potentially on the peroneal/fibular nerve for innervation.  The most common peroneal/fibular nerve neuropathy by far would be entrapment at the fibular head.  Today's study included peroneal/fibular nerve conduction with a target muscle being the anterior tibialis.  This clearly showed no conduction velocity loss across the fibular head.  There was no needle EMG findings in the anterior tibialis or fibularis/peroneus longus.  If this did represent peroneal/fibular nerve mononeuropathy it would be distal to the innervation of the anterior tibialis muscle.  Clinically I am not sure that fits with her ongoing symptoms.  Lastly, electrodiagnostic study is specific but not sensitive in radiculopathy as a sensory radiculopathy or demyelinating radiculopathy cannot be characterized with this study.   Clinically her left-sided symptoms seem to fit more with sciatica or radiculitis.  The electrodiagnostic study did not show specific compressive nerve entrapment at the common areas of the fibular/peroneal nerve.  Atrophy of the EDB muscles can be seen frequently.  There could be some distal polyneuropathy but not really shown on the study.  There was also no evidence of motor neuron disease or myopathy.  Recommendations: 1.  Follow-up with referring physician. 2.  Continue current management of symptoms.  Consider referral to tertiary care neurology consultation.  ___________________________ Laurence Spates FAAPMR Board Certified, American Board of Physical Medicine and Rehabilitation    Nerve Conduction Studies Anti Sensory Summary Table   Stim Site NR Peak (ms) Norm Peak (ms) P-T Amp (V) Norm P-T Amp Site1 Site2 Delta-P (ms) Dist (cm) Vel (m/s) Norm Vel (m/s)  Left Sup Fibular Anti Sensory (Ant Lat Mall)  28.1C  14 cm    4.0 <4.4 6.4 >5.0 14 cm Ant Lat Mall 4.0 14.0 35 >32  Right Sural Anti Sensory (Lat Mall)  27.5C  Calf    4.0 <4.0 *1.5 >5.0 Calf Lat Mall 4.0 14.0 35 >35   Motor Summary Table   Stim Site NR Onset (ms) Norm Onset (ms) O-P Amp (mV) Norm O-P Amp Site1 Site2 Delta-0 (ms) Dist (cm) Vel (m/s) Norm Vel (m/s)  Left Dp Br Fibular Motor (AntTibialis)  27.8C  Fib Head    2.5 <4.2 2.5  Poplit Fib Head 1.7 10.0 59 >40.5  Poplit    4.2 <5.7 2.4         Right Dp Br Fibular Motor (AntTibialis)  27.6C  Fib Head    2.4 <4.2 2.9  Poplit Fib Head 2.2 10.0  45 >40.5  Poplit    4.6 <5.7 2.9         Left Fibular Motor (Ext Dig Brev)  28.2C  Ankle    5.9 <6.1 *0.1 >2.5 B Fib Ankle 7.1 30.0 42 >38  B Fib    13.0  1.0  Poplt B Fib 1.6 9.0 56 >40  Poplt    14.6  2.1         Right Fibular Motor (Ext Dig Brev)  27.4C  Ankle    4.2 <6.1 *0.5 >2.5 B Fib Ankle 7.7 31.0 40 >38  B Fib    11.9  0.4  Poplt B Fib 2.5 10.0 40 >40  Poplt    14.4  0.5          EMG   Side Muscle Nerve Root  Ins Act Fibs Psw Amp Dur Poly Recrt Int Fraser Din Comment  Left AntTibialis Dp Br Peron L4-5 Nml Nml Nml Nml Nml 0 Nml Nml   Left Fibularis Longus  Sup Br Peron L5-S1 Nml Nml Nml Nml Nml 0 Nml Nml   Left MedGastroc Tibial S1-2 Nml Nml Nml Nml Nml 0 Nml Nml   Left VastusMed Femoral L2-4 Nml Nml Nml Nml Nml 0 Nml Nml   Left BicepsFemS Sciatic L5-S1 Nml Nml Nml Nml Nml 0 Nml Nml   Left GluteusMed SupGluteal L4-S1 Nml Nml Nml Nml Nml 0 Nml Nml     Nerve Conduction Studies Anti Sensory Left/Right Comparison   Stim Site L Lat (ms) R Lat (ms) L-R Lat (ms) L Amp (V) R Amp (V) L-R Amp (%) Site1 Site2 L Vel (m/s) R Vel (m/s) L-R Vel (m/s)  Sup Fibular Anti Sensory (Ant Lat Mall)  28.1C  14 cm 4.0   6.4   14 cm Ant Lat Mall 35    Sural Anti Sensory (Lat Mall)  27.5C  Calf  4.0   *1.5  Calf Lat Mall  35    Motor Left/Right Comparison   Stim Site L Lat (ms) R Lat (ms) L-R Lat (ms) L Amp (mV) R Amp (mV) L-R Amp (%) Site1 Site2 L Vel (m/s) R Vel (m/s) L-R Vel (m/s)  Dp Br Fibular Motor (AntTibialis)  27.8C  Fib Head 2.5 2.4 0.1 2.5 2.9 13.8 Poplit Fib Head 59 45 14  Poplit 4.2 4.6 0.4 2.4 2.9 17.2       Fibular Motor (Ext Dig Brev)  28.2C  Ankle 5.9 4.2 *1.7 *0.1 *0.5 *80.0 B Fib Ankle 42 40 2  B Fib 13.0 11.9 1.1 1.0 0.4 60.0 Poplt B Fib 56 40 16  Poplt 14.6 14.4 0.2 2.1 0.5 76.2          Waveforms:

## 2022-02-20 ENCOUNTER — Encounter: Payer: Self-pay | Admitting: Physical Medicine and Rehabilitation

## 2022-02-20 NOTE — Progress Notes (Signed)
Sheena Gray - 57 y.o. female MRN [id number]  Date of birth: [birth year]  Office Visit Note: Visit Date: 02/07/2022 PCP: Donnajean Lopes, MD Referred by: Eunice Blase, MD  Subjective: Chief Complaint  Patient presents with   Right Leg - Numbness, Pain, Weakness   Left Leg - Numbness, Pain, Weakness   HPI:  Sheena Gray is a 57 y.o. female who comes in today at the request of Dr. Eunice Blase for evaluation and management of chronic, worsening and severe pain, numbness and tingling in the Bilateral lower extremities.  Dr. Junius Roads kindly send me a referral and text messaging on possible electrodiagnostic study to take a second look at a diagnosed bilateral peroneal neuropathy.  Just as a bit of information the peroneal nerve has been redesignated the fibular nerve and those 2 terms are synonymous.  She reports today mostly constant numbness and tingling in both legs more from the knees down but really both legs sometimes from the buttock region.  She gets numbness and tingling in the second and third digit on the right foot.  Her history piece together from her interview today in the chart as we can see it details an older episode of lumbar sciatica type pain many years ago and then more recently in about 2019.  This was originally treated at [facility] with Dr. Rolena Infante.  MRI from 2019 of the lumbar spine cannot be reviewed but per Dr. Rolena Infante notes showed degenerative changes at L3-4 which were really nonsurgical in his opinion.  She had injections that actually made the symptoms worse and the prior episode and so he ended up referring her for physical therapy which she had an extensive amount at [facility].  She also was referred to Dr. Suella Broad a physiatrist at that office.  During that course of treatment she was referred to Dr. Sarina Ill and electrodiagnostic study of the lower limbs was performed.  This study was completed 01/01/2019.  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  There was no evidence of radiculopathy or polyneuropathy.  She reports there symptoms have worsened and it really is somewhat functionally limiting that she cannot do some of the things she used to do as far as exercise and walking and running etc.   Review of Systems  Musculoskeletal:  Positive for back pain.  Neurological:  Positive for tingling, focal weakness and weakness.  All other systems reviewed and are negative.  Otherwise per HPI.  Assessment & Plan: Visit Diagnoses:    ICD-10-CM   1. Paresthesia of skin  R20.2 NCV with EMG (electromyography)    2. Weakness  R53.1     3. Bilateral leg pain  M79.604    M79.605       Plan: Impression: Clinically her complaints are multifactorial.  Left-sided complaints seem to be consistent with a lightest or sciatica type pain but she does have Bilateral symptoms.  Prior lumbar workup was fairly insignificant.  She has failed all manner of conservative care.  Prior neurology evaluation included electrodiagnostic study with diagnosis of indeterminant peroneal nerve neuropathy.  This diagnosis is somewhat limited given the fact that the test was performed at the EDB muscles which this patient does have atrophy.  Electrodiagnostic study performed today.  The above electrodiagnostic is ABNORMAL but somewhat difficult to interpret.  The target muscle for measuring peroneal/fibular nerve conductions and complex motor action potentials is the EDB.  This patient does have atrophy of the bilateral EDB  musculature -this can be a fairly common finding in patients as they age.  This tends to represent a Catch-22 as trying to measure the peroneal/fibular nerve relies on the EDB muscle which would in turn rely potentially on the peroneal/fibular nerve for innervation.  The most common peroneal/fibular nerve neuropathy by far would be entrapment at the fibular head.  Today's study included peroneal/fibular nerve  conduction with a target muscle being the anterior tibialis.  This clearly showed no conduction velocity loss across the fibular head.  There was no needle EMG findings in the anterior tibialis or fibularis/peroneus longus.  If this did represent peroneal/fibular nerve mononeuropathy it would be distal to the innervation of the anterior tibialis muscle.  Clinically I am not sure that fits with her ongoing symptoms.  Lastly, electrodiagnostic study is specific but not sensitive in radiculopathy as a sensory radiculopathy or demyelinating radiculopathy cannot be characterized with this study.  Clinically her left-sided symptoms seem to fit more with sciatica or radiculitis.  The electrodiagnostic study did not show specific compressive nerve entrapment at the common areas of the fibular/peroneal nerve.  Atrophy of the EDB muscles can be seen frequently.  There could be some distal polyneuropathy but not really shown on the study.  There was also no evidence of motor neuron disease or myopathy.  Recommendations: 1.  Follow-up with referring physician. 2.  Continue current management of symptoms.  Consider referral to tertiary care neurology consultation.  Meds & Orders: No orders of the defined types were placed in this encounter.   Orders Placed This Encounter  Procedures   NCV with EMG (electromyography)    Follow-up: Return for Eunice Blase, MD.   Procedures: No procedures performed  EMG & NCV Findings: **Please note that the sensory nerve conduction label right sural nerve was in fact the right superficial fibular nerve and was mislabeled Evaluation of the left fibular motor, the right fibular motor, and the right sural sensory nerves showed reduced amplitude (L0.1, R0.5, R1.5 V).  All remaining nerves (as indicated in the following tables) were within normal limits.  Left vs. Right side comparison data for the fibular motor nerve indicates abnormal L-R latency difference (1.7 ms) and  abnormal L-R amplitude difference (80.0 %).    All examined muscles (as indicated in the following table) showed no evidence of electrical instability.    Impression: The above electrodiagnostic is ABNORMAL but somewhat difficult to interpret.  The target muscle for measuring peroneal/fibular nerve conductions and complex motor action potentials is the EDB.  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  This tends to represent a Catch-22 as trying to measure the peroneal/fibular nerve relies on the EDB muscle which would in turn rely potentially on the peroneal/fibular nerve for innervation.  The most common peroneal/fibular nerve neuropathy by far would be entrapment at the fibular head.  Today's study included peroneal/fibular nerve conduction with a target muscle being the anterior tibialis.  This clearly showed no conduction velocity loss across the fibular head.  There was no needle EMG findings in the anterior tibialis or fibularis/peroneus longus.  If this did represent peroneal/fibular nerve mononeuropathy it would be distal to the innervation of the anterior tibialis muscle.  Clinically I am not sure that fits with her ongoing symptoms.  Lastly, electrodiagnostic study is specific but not sensitive in radiculopathy as a sensory radiculopathy or demyelinating radiculopathy cannot be characterized with this study.  Clinically her left-sided symptoms seem to fit more with sciatica or radiculitis.  The electrodiagnostic study did not show specific compressive nerve entrapment at the common areas of the fibular/peroneal nerve.  Atrophy of the EDB muscles can be seen frequently.  There could be some distal polyneuropathy but not really shown on the study.  There was also no evidence of motor neuron disease or myopathy.  Recommendations: 1.  Follow-up with referring physician. 2.  Continue current management of symptoms.   Consider referral to tertiary care neurology consultation.  ___________________________ Laurence Spates FAAPMR Board Certified, American Board of Physical Medicine and Rehabilitation    Nerve Conduction Studies Anti Sensory Summary Table   Stim Site NR Peak (ms) Norm Peak (ms) P-T Amp (V) Norm P-T Amp Site1 Site2 Delta-P (ms) Dist (cm) Vel (m/s) Norm Vel (m/s)  Left Sup Fibular Anti Sensory (Ant Lat Mall)  28.1C  14 cm    4.0 <4.4 6.4 >5.0 14 cm Ant Lat Mall 4.0 14.0 35 >32  Right Sural Anti Sensory (Lat Mall)  27.5C  Calf    4.0 <4.0 *1.5 >5.0 Calf Lat Mall 4.0 14.0 35 >35   Motor Summary Table   Stim Site NR Onset (ms) Norm Onset (ms) O-P Amp (mV) Norm O-P Amp Site1 Site2 Delta-0 (ms) Dist (cm) Vel (m/s) Norm Vel (m/s)  Left Dp Br Fibular Motor (AntTibialis)  27.8C  Fib Head    2.5 <4.2 2.5  Poplit Fib Head 1.7 10.0 59 >40.5  Poplit    4.2 <5.7 2.4         Right Dp Br Fibular Motor (AntTibialis)  27.6C  Fib Head    2.4 <4.2 2.9  Poplit Fib Head 2.2 10.0 45 >40.5  Poplit    4.6 <5.7 2.9         Left Fibular Motor (Ext Dig Brev)  28.2C  Ankle    5.9 <6.1 *0.1 >2.5 B Fib Ankle 7.1 30.0 42 >38  B Fib    13.0  1.0  Poplt B Fib 1.6 9.0 56 >40  Poplt    14.6  2.1         Right Fibular Motor (Ext Dig Brev)  27.4C  Ankle    4.2 <6.1 *0.5 >2.5 B Fib Ankle 7.7 31.0 40 >38  B Fib    11.9  0.4  Poplt B Fib 2.5 10.0 40 >40  Poplt    14.4  0.5          EMG   Side Muscle Nerve Root Ins Act Fibs Psw Amp Dur Poly Recrt Int Fraser Din Comment  Left AntTibialis Dp Br Peron L4-5 Nml Nml Nml Nml Nml 0 Nml Nml   Left Fibularis Longus  Sup Br Peron L5-S1 Nml Nml Nml Nml Nml 0 Nml Nml   Left MedGastroc Tibial S1-2 Nml Nml Nml Nml Nml 0 Nml Nml   Left VastusMed Femoral L2-4 Nml Nml Nml Nml Nml 0 Nml Nml   Left BicepsFemS Sciatic L5-S1 Nml Nml Nml Nml Nml 0 Nml Nml   Left GluteusMed SupGluteal L4-S1 Nml Nml Nml Nml Nml 0 Nml Nml     Nerve Conduction Studies Anti Sensory Left/Right Comparison    Stim Site L Lat (ms) R Lat (ms) L-R Lat (ms) L Amp (V) R Amp (V) L-R Amp (%) Site1 Site2 L Vel (m/s) R Vel (m/s) L-R Vel (m/s)  Sup Fibular Anti Sensory (Ant Lat Mall)  28.1C  14 cm 4.0   6.4   14 cm  Ant Lat Mall 35    Sural Anti Sensory (Lat Mall)  27.5C  Calf  4.0   *1.5  Calf Lat Mall  35    Motor Left/Right Comparison   Stim Site L Lat (ms) R Lat (ms) L-R Lat (ms) L Amp (mV) R Amp (mV) L-R Amp (%) Site1 Site2 L Vel (m/s) R Vel (m/s) L-R Vel (m/s)  Dp Br Fibular Motor (AntTibialis)  27.8C  Fib Head 2.5 2.4 0.1 2.5 2.9 13.8 Poplit Fib Head 59 45 14  Poplit 4.2 4.6 0.4 2.4 2.9 17.2       Fibular Motor (Ext Dig Brev)  28.2C  Ankle 5.9 4.2 *1.7 *0.1 *0.5 *80.0 B Fib Ankle 42 40 2  B Fib 13.0 11.9 1.1 1.0 0.4 60.0 Poplt B Fib 56 40 16  Poplt 14.6 14.4 0.2 2.1 0.5 76.2          Waveforms:              Clinical History: Electrodiagnostic study of both lower limbs 01/01/2019 Summary: The right peroneal motor nerve showed reduced amplitude(0.73m, N>2) and decreased conduction velocity(fib head to ankle, 330m, N>44)  and decreased conduction velocity(pop fossa to fib head, 4013m N>44). The left peroneal motor nerve showed delayed distal onset latency(10.1ms67m<6.5) and reduced amplitude(0.5mv,27m2). The left superficial peroneal sensory nerve showed no response. The right superficial peroneal sensory nerve showed reduced amplitude (3uV, N>6). The right peroneal F wave showed delayed latency(56.3, N<56). The right peroneal F wave showed no response. All remaining nerves (as indicated in the following tables) were within normal limits. All muscles (as indicated in the following tables) were within normal limits.        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 No evidence of lumbar radiculopathy, polyneuropathy or muscle disorder. EMG needle study did not show any acute or ongoing denervation.      AntonSarina Ill      Objective:  VS:  HT:    WT:   BMI:     BP:   HR: bpm  TEMP: ( )  RESP:  Physical Exam Vitals and nursing note reviewed.  Constitutional:      General: She is not in acute distress.    Appearance: Normal appearance. She is well-developed. She is not ill-appearing.  HENT:     Head: Normocephalic and atraumatic.  Eyes:     Conjunctiva/sclera: Conjunctivae normal.     Pupils: Pupils are equal, round, and reactive to light.  Cardiovascular:     Rate and Rhythm: Normal rate.     Pulses: Normal pulses.  Pulmonary:     Effort: Pulmonary effort is normal.  Musculoskeletal:        General: Tenderness present. No swelling, deformity or signs of injury.     Right lower leg: No edema.     Left lower leg: No edema.     Comments: The patient ambulates without aid she has good strength in the lower extremities with knee extension and knee flexion and dorsiflexion and plantarflexion and EHL.  She does have atrophy of the bilateral EDB musculature.  She has no allodynia or swelling or disc colorization.  She has an equivocal slump test on the left.  Some hamstring tightness on the left.  There is dysesthesia really in a nondermatomal fashion.  More subjective symptoms in the the middle 2 digits on the right foot.  Skin:    General: Skin is warm and dry.  Findings: No erythema, lesion or rash.  Neurological:     General: No focal deficit present.     Mental Status: She is alert and oriented to person, place, and time.     Cranial Nerves: No cranial nerve deficit.     Sensory: No sensory deficit.     Motor: Weakness present. No abnormal muscle tone.     Coordination: Coordination normal.     Gait: Gait normal.  Psychiatric:        Mood and Affect: Mood normal.        Behavior: Behavior normal.      Imaging: No results found.

## 2022-07-30 ENCOUNTER — Other Ambulatory Visit: Payer: Self-pay | Admitting: Family Medicine

## 2022-07-30 DIAGNOSIS — Z8639 Personal history of other endocrine, nutritional and metabolic disease: Secondary | ICD-10-CM

## 2022-08-05 ENCOUNTER — Emergency Department (HOSPITAL_BASED_OUTPATIENT_CLINIC_OR_DEPARTMENT_OTHER)
Admission: EM | Admit: 2022-08-05 | Discharge: 2022-08-05 | Disposition: A | Discharge status: Home / Self Care | Payer: No Typology Code available for payment source | Attending: Emergency Medicine | Admitting: Emergency Medicine

## 2022-08-05 ENCOUNTER — Other Ambulatory Visit: Payer: Self-pay

## 2022-08-05 ENCOUNTER — Emergency Department (HOSPITAL_BASED_OUTPATIENT_CLINIC_OR_DEPARTMENT_OTHER): Payer: No Typology Code available for payment source

## 2022-08-05 ENCOUNTER — Encounter (HOSPITAL_BASED_OUTPATIENT_CLINIC_OR_DEPARTMENT_OTHER): Payer: Self-pay | Admitting: *Deleted

## 2022-08-05 DIAGNOSIS — N200 Calculus of kidney: Secondary | ICD-10-CM

## 2022-08-05 DIAGNOSIS — N132 Hydronephrosis with renal and ureteral calculous obstruction: Secondary | ICD-10-CM | POA: Insufficient documentation

## 2022-08-05 DIAGNOSIS — R1031 Right lower quadrant pain: Secondary | ICD-10-CM | POA: Diagnosis present

## 2022-08-05 LAB — URINALYSIS, ROUTINE W REFLEX MICROSCOPIC
Bacteria, UA: NONE SEEN
Bilirubin Urine: NEGATIVE
Glucose, UA: NEGATIVE mg/dL
Ketones, ur: NEGATIVE mg/dL
Nitrite: NEGATIVE
RBC / HPF: >50 RBC/hpf (ref 0–5)
Specific Gravity, Urine: 1.02 (ref 1.005–1.030)
pH: 7 (ref 5.0–8.0)

## 2022-08-05 LAB — CBC WITH DIFFERENTIAL/PLATELET
Abs Immature Granulocytes: 0.03 10*3/uL (ref 0.00–0.07)
Basophils Absolute: 0.2 10*3/uL — ABNORMAL HIGH (ref 0.0–0.1)
Basophils Relative: 1 %
Eosinophils Absolute: 0.3 10*3/uL (ref 0.0–0.5)
Eosinophils Relative: 2 %
HCT: 38.9 % (ref 36.0–46.0)
Hemoglobin: 13.5 g/dL (ref 12.0–15.0)
Immature Granulocytes: 0 %
Lymphocytes Relative: 16 %
Lymphs Abs: 2.1 10*3/uL (ref 0.7–4.0)
MCH: 32.6 pg (ref 26.0–34.0)
MCHC: 34.7 g/dL (ref 30.0–36.0)
MCV: 94 fL (ref 80.0–100.0)
Monocytes Absolute: 1 10*3/uL (ref 0.1–1.0)
Monocytes Relative: 8 %
Neutro Abs: 9.3 10*3/uL — ABNORMAL HIGH (ref 1.7–7.7)
Neutrophils Relative %: 73 %
Platelets: 182 10*3/uL (ref 150–400)
RBC: 4.14 MIL/uL (ref 3.87–5.11)
RDW: 13.2 % (ref 11.5–15.5)
WBC: 12.8 10*3/uL — ABNORMAL HIGH (ref 4.0–10.5)
nRBC: 0 % (ref 0.0–0.2)

## 2022-08-05 LAB — BASIC METABOLIC PANEL
Anion gap: 10 (ref 5–15)
BUN: 23 mg/dL — ABNORMAL HIGH (ref 6–20)
CO2: 23 mmol/L (ref 22–32)
Calcium: 9.6 mg/dL (ref 8.9–10.3)
Chloride: 107 mmol/L (ref 98–111)
Creatinine, Ser: 0.81 mg/dL (ref 0.44–1.00)
GFR, Estimated: >60 mL/min (ref >60)
Glucose, Bld: 107 mg/dL — ABNORMAL HIGH (ref 70–99)
Potassium: 3.6 mmol/L (ref 3.5–5.1)
Sodium: 140 mmol/L (ref 135–145)

## 2022-08-05 MED ORDER — ONDANSETRON 8 MG PO TBDP: 8.0000 mg | ORAL_TABLET | Freq: Three times a day (TID) | ORAL | 0 refills | Status: DC | PRN

## 2022-08-05 MED ORDER — MORPHINE SULFATE (PF) 4 MG/ML IV SOLN
4.0000 mg | Freq: Once | INTRAVENOUS | Status: AC
  Administered 2022-08-05: 4 mg via INTRAVENOUS
  Filled 2022-08-05: qty 1

## 2022-08-05 MED ORDER — TAMSULOSIN HCL 0.4 MG PO CAPS: 0.4000 mg | ORAL_CAPSULE | Freq: Every day | ORAL | 0 refills | Status: DC

## 2022-08-05 MED ORDER — ONDANSETRON HCL 4 MG/2ML IJ SOLN
4.0000 mg | Freq: Once | INTRAMUSCULAR | Status: AC
  Administered 2022-08-05: 4 mg via INTRAVENOUS
  Filled 2022-08-05: qty 2

## 2022-08-05 MED ORDER — OXYCODONE-ACETAMINOPHEN 5-325 MG PO TABS: 1.0000 | ORAL_TABLET | Freq: Four times a day (QID) | ORAL | 0 refills | Status: DC | PRN

## 2022-08-05 MED ORDER — LACTATED RINGERS IV SOLN: INTRAVENOUS | Status: DC

## 2022-08-05 NOTE — ED Provider Notes (Signed)
[facility] EMERGENCY DEPARTMENT AT [facility] Provider Note   CSN: [id number] Arrival date & time: 08/05/22  8295     History  Chief Complaint  Patient presents with   Abdominal Pain    Sheena Gray is a 57 y.o. female.  57 year old female presents with right-sided flank pain that began x 1 day.  Pain started in her right lower quadrant and now is gone to her right flank.  She denies any fever or chills.  Has had some hematuria.  Seen this week for similar symptoms and placed on antibiotics for suspected UTI.  Does have a history of kidney stones.  Denies any GYN symptoms.  Denies any dysuria.       Home Medications Prior to Admission medications   Medication Sig Start Date End Date Taking? Authorizing Provider  alendronate (FOSAMAX) 70 MG tablet 1 TABLET BY MOUTH EVERY WEEK, FIRST THING IN MORNING WITH FULL GLASS OF WATER    [provider]  amitriptyline (ELAVIL) 25 MG tablet Take 1 tablet by mouth at bedtime. 01/31/20   [provider]  Multiple Vitamins-Minerals (CENTRUM SILVER 50+WOMEN PO) Take 1 tablet by mouth daily.    [provider]  sertraline (ZOLOFT) 100 MG tablet Take 100 mg by mouth daily. 11/03/18   [provider]      Allergies    Shellfish allergy    Review of Systems   Review of Systems  All other systems reviewed and are negative.   Physical Exam Updated Vital Signs Wt 69.9 kg   BMI 24.86 kg/m  Physical Exam Vitals and nursing note reviewed.  Constitutional:      General: She is not in acute distress.    Appearance: Normal appearance. She is well-developed. She is not toxic-appearing.  HENT:     Head: Normocephalic and atraumatic.  Eyes:     General: Lids are normal.     Conjunctiva/sclera: Conjunctivae normal.     Pupils: Pupils are equal, round, and reactive to light.  Neck:     Thyroid: No thyroid mass.     Trachea: No tracheal deviation.  Cardiovascular:     Rate and Rhythm: Normal rate  and regular rhythm.     Heart sounds: Normal heart sounds. No murmur heard.    No gallop.  Pulmonary:     Effort: Pulmonary effort is normal. No respiratory distress.     Breath sounds: Normal breath sounds. No stridor. No decreased breath sounds, wheezing, rhonchi or rales.  Abdominal:     General: There is no distension.     Palpations: Abdomen is soft.     Tenderness: There is abdominal tenderness in the right lower quadrant. There is guarding. There is no rebound.    Musculoskeletal:        General: No tenderness. Normal range of motion.     Cervical back: Normal range of motion and neck supple.  Skin:    General: Skin is warm and dry.     Findings: No abrasion or rash.  Neurological:     Mental Status: She is alert and oriented to person, place, and time. Mental status is at baseline.     GCS: GCS eye subscore is 4. GCS verbal subscore is 5. GCS motor subscore is 6.     Cranial Nerves: Cranial nerves are intact. No cranial nerve deficit.     Sensory: No sensory deficit.     Motor: Motor function is intact.  Psychiatric:  Attention and Perception: Attention normal.        Speech: Speech normal.        Behavior: Behavior normal.     ED Results / Procedures / Treatments   Labs (all labs ordered are listed, but only abnormal results are displayed) Labs Reviewed  URINALYSIS, ROUTINE W REFLEX MICROSCOPIC  CBC WITH DIFFERENTIAL/PLATELET  BASIC METABOLIC PANEL    EKG None  Radiology No results found.  Procedures Procedures    Medications Ordered in ED Medications  lactated ringers infusion (has no administration in time range)  ondansetron (ZOFRAN) injection 4 mg (has no administration in time range)  morphine (PF) 4 MG/ML injection 4 mg (has no administration in time range)    ED Course/ Medical Decision Making/ A&P                             Medical Decision Making Amount and/or Complexity of Data Reviewed Labs: ordered. Radiology:  ordered.  Risk Prescription drug management.   Patient medicated for pain here and feels better.  Concern for possible kidney stone and her urinalysis does show hematuria.  CT scan per my review and interpretation consistent with 5 mm right-sided stone.  Renal function normal here.  Patient will be discharged home on Flomax, analgesics and given referral to urology.        Final Clinical Impression(s) / ED Diagnoses Final diagnoses:  None    Rx / DC Orders ED Discharge Orders     None         Lorre Nick, MD 08/05/22 1014

## 2022-08-05 NOTE — ED Notes (Signed)
To CT via w/c.

## 2022-08-05 NOTE — ED Triage Notes (Addendum)
BIB spouse from home for RLQ abd pain, also endorses nausea, "feel faint", hematuria, 3 large BM today (diarrhea), and some dizziness. Seen recently for similar. Started on prophylactic antibiotic for hematuria, today is day 3 of ABT, but has not had antibiotic today. Alert, NAD, calm, steady gait. EDP into room during triage.

## 2022-08-14 ENCOUNTER — Ambulatory Visit
Admission: RE | Admit: 2022-08-14 | Discharge: 2022-08-14 | Disposition: A | Discharge status: Home / Self Care | Payer: No Typology Code available for payment source | Source: Ambulatory Visit | Attending: Family Medicine | Admitting: Family Medicine

## 2022-08-14 DIAGNOSIS — Z8639 Personal history of other endocrine, nutritional and metabolic disease: Secondary | ICD-10-CM

## 2022-08-17 ENCOUNTER — Other Ambulatory Visit: Payer: No Typology Code available for payment source

## 2022-08-20 ENCOUNTER — Other Ambulatory Visit: Payer: No Typology Code available for payment source

## 2022-09-25 ENCOUNTER — Other Ambulatory Visit: Payer: Self-pay | Admitting: Family Medicine

## 2022-09-25 DIAGNOSIS — Z87891 Personal history of nicotine dependence: Secondary | ICD-10-CM

## 2022-10-12 ENCOUNTER — Ambulatory Visit
Admission: RE | Admit: 2022-10-12 | Discharge: 2022-10-12 | Disposition: A | Discharge status: Home / Self Care | Payer: No Typology Code available for payment source | Source: Ambulatory Visit | Attending: Family Medicine | Admitting: Family Medicine

## 2022-10-12 DIAGNOSIS — Z87891 Personal history of nicotine dependence: Secondary | ICD-10-CM

## 2022-10-31 ENCOUNTER — Telehealth: Payer: Self-pay

## 2022-10-31 NOTE — Telephone Encounter (Signed)
This patient called in and was transferred to new patient referrals. She had a referral that was closed as unable to contact, but she stated she was just wanting to get a message back to Dr Lucia Gaskins.   She wanted to let Dr Lucia Gaskins know that she is having a recurrence of the symptoms that she was previously seen for in 2021. She is wanting to know what you would recommend she do. She is asking that Dr Lucia Gaskins give her a call back sometime this week to discuss.  I let her know I would send the message to Dr Lucia Gaskins but I was unsure if she would be able to reach out since it has been over three years since we have seen her. I was able to get her scheduled for a consultation in the next available new patient slot in February, however she stated that she is in pain and that appointment is too far out.  I did place her on the wait list  Note in referral states that patient can be scheduled for NCV EMG but she would technically be considered a new patient.   Please advise.

## 2022-11-05 ENCOUNTER — Other Ambulatory Visit: Payer: Self-pay | Admitting: Family Medicine

## 2022-11-05 ENCOUNTER — Encounter (INDEPENDENT_AMBULATORY_CARE_PROVIDER_SITE_OTHER): Payer: Self-pay | Admitting: Neurology

## 2022-11-05 DIAGNOSIS — Z0289 Encounter for other administrative examinations: Secondary | ICD-10-CM | POA: Diagnosis not present

## 2022-11-05 DIAGNOSIS — Z87891 Personal history of nicotine dependence: Secondary | ICD-10-CM

## 2022-11-05 NOTE — Telephone Encounter (Signed)
I really can't diagnose or give advise over the phone when I haven;t seen her. Can she come in for an appointment? If an NP has an opening she had peroneal neuropathy in the past and sounds like the same thing or you can use an open emg spot in December for me?

## 2022-11-05 NOTE — Telephone Encounter (Signed)
There is a separate message already sent to Dr Lucia Gaskins for this.

## 2022-11-05 NOTE — Telephone Encounter (Signed)
I am opening up December 20th which is a Friday morning if you want to place her there, if I have any other openings she can come in sooner

## 2023-02-13 ENCOUNTER — Ambulatory Visit: Payer: Self-pay | Admitting: Neurology

## 2023-02-13 ENCOUNTER — Encounter: Payer: Self-pay | Admitting: Neurology

## 2023-02-13 VITALS — BP 125/80 | HR 80 | Ht 66.0 in | Wt 152.5 lb

## 2023-02-13 DIAGNOSIS — M7918 Myalgia, other site: Secondary | ICD-10-CM

## 2023-02-13 DIAGNOSIS — M545 Low back pain, unspecified: Secondary | ICD-10-CM

## 2023-02-13 DIAGNOSIS — R252 Cramp and spasm: Secondary | ICD-10-CM

## 2023-02-13 DIAGNOSIS — G8929 Other chronic pain: Secondary | ICD-10-CM

## 2023-02-13 MED ORDER — METHYLPREDNISOLONE 4 MG PO TBPK
ORAL_TABLET | ORAL | 1 refills | Status: DC
Start: 2023-02-13 — End: 2023-07-11

## 2023-02-13 MED ORDER — SERTRALINE HCL 100 MG PO TABS: ORAL_TABLET | ORAL | 4 refills | Status: AC

## 2023-02-13 MED ORDER — CLONAZEPAM 0.5 MG PO TABS: ORAL_TABLET | ORAL | 1 refills | Status: AC

## 2023-02-13 NOTE — Progress Notes (Addendum)
 [facility]    Provider:  Dr Ines Requesting Provider: Hughie Sharper, MD Primary Care Provider:  Hughie Sharper, MD  CC:  Leg numbness  Addendum 08/20/2023: History: Patient with > 5 years of lower leg paresthesias. 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 With conservative measures she improved but now worsening. Recently repeated MRI lumbar spine was unremarkable without etiology for her symptoms. Left leg is worse symptomatically. Repeat EMG/NCS showed moderately-severe bilateral peroneal mononeuropathies. Cannot localize based on the nerve conductions or EMG needle exam. No evidence of lumbar radiculopathy, polyneuropathy or muscle disorder. This is consistent with emg/ncs performed 01/01/2019. MRI Lumbar Spine 07/26/2023 showed No Significant foraminal or spinal stenosis  02/13/2023: We have seen this patient and extensively evaluated her as below and felt her nerve symptoms were due to peroneal neuropathy due to her profession as with bending and scooting down and nerve compression she was more cautious with positioning and really improved so we thought we had the right diagnoses, symptoms resolved in the legs. When he would feel peroneal neuropathy and numbness she would change positions and it would help. Her sister moved here for [facility], that started a lot of stress. She feels she has PTSD and had a traumatic resugence of feelings rage, hypervigilance, pain in the back, radoiating down the legs and down the later part of the leg to the bottom of the foot and pain(pointing to the S! Joint, also pain radiating down the lateral lowr leg in te peroneal distribution. Father died at 2, she was primary caregiving, lots of emotional stress, intense alcohol and stressors in te family, very emotionally traumatic events growing up, very good insight into feeling that this may  exacerbate her symptoms, she needs a very good trauma counsekor. She is also taking care of her 57 year old demented mother. Mother is declining both physical and mental.   Discussed [facility] which can help. We discussed in detail. She is stressed, she has great insight, feels her stress is worsening however I do feel she has some either peroneal neuropathies or lumbar radiculopathy. She went to PT, had dry needling, no exercises, had trigger point injections in the lower leg muscles.  Patient complains of symptoms per HPI as well as the following symptoms: anxiety . Pertinent negatives and positives per HPI. All others negative  Patient complains of symptoms per HPI as well as the following symptoms: difficulty sleeping . Pertinent negatives and positives per HPI. All others negative   02/11/2019: Phone Call: NO CHARGE VISIT: I spoke to patient briefly on the phone today, I reviewed the work-up and findings, I did extensive lab panels to complement prior testing that she already had including Lyme, B1, B12, vitamin B1, pan ANCA, Sjogren's antibodies, IFE and PE, vitamin B6, heavy metals, rheumatoid factor, ANA with reflex, methylmalonic acid, B12 and folate panel which were all unremarkable,ANCA proteinase 3 was slightly elevated at 3.8 (3.5 upper limit) but negative c-ANCA, p-ANCA, myeloperoxidase antibodies, I just do not think that this is clinically significant for patient.  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 Donaciano Sprang and Dr. Gifford.  She has been evaluated at [facility] with imaging, MRI of the lumbar spine, she is currently in physical  therapy, I advised her to possibly discussed with physical therapy dry needling in addition to her other  therapy.  We discussed that [facility] thought this might be central sensitization we discussed see RPS I am not sure she fulfills the diagnostic criteria for that but definitely follow-up with the pain specialists as this is their expertise.  MRI of the brain and the cervical spine were both negative for etiology for all her symptoms.  I apologized to patient that I could not find the etiology of her symptoms, she says she has been suffering with this for many many years but recently become worsening and prolonged episode over the last year, she reports chronic back pain, radicular symptoms, paresthesias, weakness in her lower extremities, EMG nerve conduction study did not show any myopathy or myelopathic signs, no acute or ongoing radiculopathy however I did discuss with patient if she did have radiculopathy in the past is because it is not shown on the lumbar imaging right now does not mean that she can have it in the past and at that time she had some radicular nerve entrapment that could be causing residual pain over the years.  Physician gave her some amitriptyline, this is something that we use in chronic pain management and I advised her to follow-up with that physician and pain management.  She has seen neurology in the past who suggested there may be some functional overlay however I am not qualified to comment on that all I know is that I have evaluated her and I really cannot think of any thing else to do.  I did apologize if there is something that we missed and in that case I really did encourage her to go to [facility] or [facility] if she feels as though she does not have any answers and this is affecting her quality of life which she does, we have those wonderful academic institutions and there is no reason she should not utilize those resources.  HPI:  ANASTASYA Gray is a 58 y.o. female here as requested by Hughie Sharper, MD for bilateral leg numbness. PMHx chronic low back pain, kidney stones.  I  reviewed Dr. Gifford notes: Patient is on amitriptyline, methocarbamol, prednisone and sertraline , she has pain in the lumbar spine, chronic back pain, never smoker.  Patient was seen for chronic low back pain, I reviewed procedure note which shows that a spinal needle was placed in the L4-L5 interspace in the midline using fluoroscopic imaging, the epidural space was localized, 1 mL of Omnipaque was injected, there was good epidural flow, injected 1 mL of dexamethasone, patient tolerated the procedure very well.  The last labs I can find are from 2017, creatinine was 0.9 at that time.  I do not have her primary care records since she was referred from Dr. Bonner.Will request. She saw Dr. Jenel in 1998. She says she is in so much pain she cannot function.   Symptoms ongoing since 1998. Started in the toes and now progressive. She has chronic low back pain. She has herniated disks and disk degeneration, Dr. Burnetta does not think she is surgical, she sees Dr. Bonner for a pain block. There is pain in the low back and into the buttocks. New symptoms includes sensory changes up to the thighs. Up above her knees and the pain is in the front of the shins, muscles in her calfs are in spasm, tight sore muscles, numb and tingly up the front of the leg like shint splints and  the gastroc muscle is tight within the last month. Also pain in the inner thigh on the left leg tingly. She also reports numbness through both hands and her entire mouth went numb. She was told she may have MS. She is a Hydrologist. She is on her feet and it is intense. She is in tears every night due to the pain. Lower back pain doesn't go away. Constant, continuous. She has gone to PT and seen multiple doctors and neurology.   Reviewed notes, labs and imaging from outside physicians, which showed:  I reviewed imaging that patient brought on CD is for me from [facility] orthopedics: MRI of the lumbar spine completed in April of 2015  showed some small left foraminal disc protrusions at L3-L4 and L4-L5 which contact the corresponding dorsal root ganglia (L3 and L4) but no neural displacement or encroachment, L5-S1 mild bilateral facet arthrosis no neural displacement or encroachment.  This was basically stable from an exam they had done the prior month.  Study in April 15 was stable from the previous study in March 2015.  MRI dated October 23, 2017 showed very similar findings to prior exams, L3 and L4 nerve roots minimally contact by very small foraminal protrusions at L3-L4 and L4-L5, slight disc bulge and mild facet degenerative joint disease at L5-S1 without stenosis or nerve impingement.  I also reviewed other notes that patient brought me an extensive list and I have documented only the relevant portions: Patient has been on methocarbamol in the past, methylprednisone Dosepak, sertraline , it appears she is also had pain blocks with Dr. Gifford Jun 08, 2018.  She has had methylprednisolone  injections and Toradol  at [facility] September 2019.  She is seen Donaciano Sprang, I notes he had date on this office note however she reported having pain level 7 out of 10, complaining of pain tingling and numbness midline and left lower back, left buttocks and down the leg to the toes as well as the right foot, physical therapy including outpatient physical therapy did not help, patient taking Aleve Advil and methocarbamol, Dr. Donaciano Sprang did not feel that there was anything from a surgical standpoint that he could offer her for her lumbar spine and there is no objective clinical findings or evidence of structural abnormality, and she was transferred care to Dr. Gifford for back pain management.  It appears that patient was seen in November 2019 by Dr. Gifford as well as Dr. Donaciano Sprang.  I reviewed Dr. Gifford note history of present illness from November 28, 2017, midline low back pain, left buttocks pain, radiating down to the leg to the foot with  numbness and tingling, also right lower leg from the knee down going on for approximately 2 months, back in situ since 1998, she is tried acupuncture, chiropractic care, injections in May 2015 with Dr. Gifford which she said caused more pain.  She had PT.  Robaxin and sertraline .  Saw Dr. Sprang and neurosurgery recommended.  Dr. Marrion had done an L4 selective nerve root block and it actually just made her worse, fortunately she did get better with time, but unfortunately she is having recurrent symptoms, pain in her back radiating into the left lower limb for about 2 months, now even feeling for some foot numbness and tingling on the right radiating proximally up toward her right knee, not doing any better whatsoever.  He thought that the MRI in October 2019 actually looks a little better compared to the MRI done in 2015.  She had a reaction to gabapentin, she was sent to neurology, amitriptyline tried in the past, Dr. Burnetta not recommending surgery, they did discuss the left SI joint injection.  She cannot take gabapentin due to her reaction in the past, also prior notes from [facility] orth[facility] regarding her shoulder status post arthroscopy April 13, 2013.  I see notes also from 2014 similar left shoulder pain at that time she was on tramadol, Robaxin, Zoloft .  And around that time also Modic.  Patient also provided me with notes from [facility] back in February 1999 by Dr. Reyes Boas for chronic low back pain, shoulder and hip pain quite incapacitating, evaluated by Dr. Harden normal x-rays were told she had chronic low back pain, started on Zoloft , reported no relief, bone scan was entirely normal of the lumbosacral spine and pelvis, a great deal of stress, at the time noted she was quite overweight had lost 35 to 40 pounds very normal stature presently, poor posture, no focal neurologic deficits, diagnosed with musculoskeletal pain no evidence for myelopathy or radiculopathy.  She was  referred to Thomasina Revering for acupuncture and holistic rehabilitative approach to her problem.  Unfortunately after seeing Dr. Revering she developed numbness and tingling down her left leg, at that time after he saw her she developed paralysis of her left leg, she was transferred to [facility] emergency room and had an evaluation there by a neurologist which included MRI which was normal, her exam was normal, and the paralysis resolved, but she was still reporting persistent numbness in her left leg, back pain, numbness going from her low back to her lateral thigh all the way to her foot, feeling of her leg being asleep, at times it feels so numb it has been as if she is gotten a shot of Novocain, she also reported MRI as above and a bone scan and a trial of antidepressants without any relief, physical therapy at the time suggested piriformis syndrome and with a course of physical therapy, water exercise and stretching the physical therapist suggested her pain was somewhat better although the numbness and tingling still persist.  She provided me notes that go back to September 1999 where she reported back pain, she just found out she was pregnant, back stiffness first thing in the morning, improving with activity, feels the physical therapy is helping, nearly constant tingling sensation in her left buttocks and thigh and the whole area, feels numb, extensive evaluation in the past including an MRI which was unremarkable.  She is also seen Dr. Jenel in the past who felt that there was some functional overlay and without any objective abnormalities.  Patient also saw Dr. Isidore who referred her to Dr. Thomasina Revering for acupuncture.  Normal c-Met, TSH, CBC and sed rate in the office.  I reviewed further notes going back even further without any significantly new findings. .  Review of Systems: Patient complains of symptoms per HPI as well as the following symptoms: pain, low back pain, numbness in jaw and both hands  Pertinent negatives and positives per HPI. All others negative.   Social History   Socioeconomic History   Marital status: Married    Spouse name: Not on file   Number of children: 2   Years of education: post-graduate   Highest education level: Not on file  Occupational History   Not on file  Tobacco Use   Smoking status: Former    Current packs/day: 0.00    Types: Cigarettes  Start date: 01/20/1994    Quit date: 01/21/1999    Years since quitting: 24.0   Smokeless tobacco: Never   Tobacco comments:    teenage and college years  Vaping Use   Vaping status: Never Used  Substance and Sexual Activity   Alcohol use: Not Currently    Comment: no   Drug use: Never    Comment: no   Sexual activity: Yes    Birth control/protection: None  Other Topics Concern   Not on file  Social History Narrative   Lives at home with spouse   Social Drivers of Health   Financial Resource Strain: Not on file  Food Insecurity: Not on file  Transportation Needs: Not on file  Physical Activity: Not on file  Stress: Not on file  Social Connections: Not on file  Intimate Partner Violence: Not on file    Family History  Problem Relation Age of Onset   Breast cancer Mother    Osteoporosis Mother    Asthma Mother    Diabetes Mother    Heart disease Father    Heart attack Father 53   Diabetes Other    Colon cancer Neg Hx    Colon polyps Neg Hx    Esophageal cancer Neg Hx    Stomach cancer Neg Hx    Rectal cancer Neg Hx     Past Medical History:  Diagnosis Date   Anxiety    Chronic pain    Common peroneal nerve dysfunction, initial encounter 01/29/2019   Depression    Gallstones    Kidney stones    Osteopenia    Osteoporosis    Peroneal mononeuropathy     Patient Active Problem List   Diagnosis Date Noted   Abnormal perimenopausal bleeding 03/06/2019   Multiple neurological symptoms 12/01/2018   Chronic back pain 11/26/2017   Lumbar pain 10/16/2017   Microscopic  hematuria 11/04/2015    Past Surgical History:  Procedure Laterality Date   CHOLECYSTECTOMY     LITHOTRIPSY     SHOULDER ARTHROSCOPY  2015    Current Outpatient Medications  Medication Sig Dispense Refill   baclofen (LIORESAL) 10 MG tablet Take 10 mg by mouth at bedtime.     clonazePAM  (KLONOPIN ) 0.5 MG tablet At bedtime take 1 -2 tabs for insomnia or anxiety (0.5mg -1mg ). During the day for panic attacks try 1/2 tablet up to 2 addiitonal times a day(0.25mg ) 90 tablet 1   Melatonin 10 MG CAPS Take 1 capsule by mouth at bedtime.     methylPREDNISolone  (MEDROL  DOSEPAK) 4 MG TBPK tablet Take pills daily all together with food. Take the first dose (6 pills) as soon as possible. Take the rest each morning. For 6 days total 6-5-4-3-2-1. 21 tablet 1   Multiple Vitamins-Minerals (CENTRUM SILVER 50+WOMEN PO) Take 1 tablet by mouth daily.     pregabalin  (LYRICA ) 75 MG capsule Take 75 mg by mouth daily.     sertraline  (ZOLOFT ) 100 MG tablet Take 1.5 (150mg ) pills daily for one week thn increase to 2 pills (200mg ) daily 180 tablet 4   No current facility-administered medications for this visit.    Allergies as of 02/13/2023 - Review Complete 02/13/2023  Allergen Reaction Noted   Shellfish allergy Nausea And Vomiting 11/26/2018    Vitals: BP 125/80 (Cuff Size: Normal)   Pulse 80   Ht 5' 6 (1.676 m)   Wt 152 lb 8 oz (69.2 kg)   BMI 24.61 kg/m  Last Weight:  Wt Readings from Last  1 Encounters:  02/13/23 152 lb 8 oz (69.2 kg)   Last Height:   Ht Readings from Last 1 Encounters:  02/13/23 5' 6 (1.676 m)     Physical exam: Exam: Gen: NAD, conversant, well nourised, well groomed                     CV: RRR, no MRG. No Carotid Bruits. No peripheral edema, warm, nontender Eyes: Conjunctivae clear without exudates or hemorrhage  Neuro: Detailed Neurologic Exam  Speech:    Speech is normal; fluent and spontaneous with normal comprehension.  Cognition:    The patient is oriented to  person, place, and time;     recent and remote memory intact;     language fluent;     normal attention, concentration,     fund of knowledge Cranial Nerves:    The pupils are equal, round, and reactive to light. The fundi are normal and spontaneous venous pulsations are present. Visual fields are full to finger confrontation. Extraocular movements are intact. Trigeminal sensation is intact and the muscles of mastication are normal. The face is symmetric. The palate elevates in the midline. Hearing intact. Voice is normal. Shoulder shrug is normal. The tongue has normal motion without fasciculations.   Coordination: nml  Gait: nml  Motor Observation:    No asymmetry, no atrophy, and no involuntary movements noted. Tone:    Normal muscle tone.    Posture:    Posture is normal. normal erect    Strength:    Strength is V/V in the upper and lower limbs.      Sensation: intact to LT     Reflex Exam:  DTR's:    Deep tendon reflexes in the upper and lower extremities are normal bilaterally.   Toes:    The toes are downgoing bilaterally.   Clonus:    Clonus is absent.   Assessment/Plan: This is a very nice 58 year old female we know well and saw in the past for chronic back pain, radicular symptoms, paresthesias, weakness in the lower extremities that started in 1998 without etiology found, she had been to multiple physicians, surgeons, physical therapists, had acupuncture and chiropractic, been to orthopedists and pain specialist and had multiple interventions and medications without significant relief.  She also in the past reported  numbness in her face, and both hands, sensory changes up to above the knees, sore muscles and spasms, numbness both hands and in the face specifically the whole mouth.  Despite all her efforts no etiology has been found.  She seen neurology in the past who suggested there may be some functional overlay.  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  Addendum 08/20/2023: History: Patient with > 5 years of lower leg paresthesias. 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 With conservative measures she improved but now worsening. Recently repeated MRI lumbar spine was unremarkable without etiology for her symptoms. Left leg is worse symptomatically. Repeat EMG/NCS showed moderately-severe bilateral peroneal mononeuropathies. Cannot localize based on the nerve conductions or EMG needle exam. No evidence of lumbar radiculopathy, polyneuropathy or muscle disorder. This is consistent with emg/ncs performed 01/01/2019. MRI Lumbar Spine 07/26/2023 showed No Significant foraminal or spinal stenosis  Assessment  and Plan January [id number]: Today she is back with pain in the legs in the setting of stress and feels this has exacerbated her symptoms again of pain in the low back, also in the lateral lower leg/peroneal area, muscle cramps, she has good insight feels like her stress is exacerbating her symptoms. She did go to PT and had some treatments such as trigger point. She has a good trauma counselor  6 days of steroids to help with physical symptoms and we will see how she is doing Increase zoloft  to 150 then 200 to help with stress/anxiety Temporary anxiety, insomnia clonazepam  temporarily for sleep Increase Lyrica  for her paresthesias/peroneal neuropathy vs lumbar radiculopathy   No orders of the defined types were placed in this encounter.    Cc: Hughie Sharper, MD,  Hughie Sharper, MD  [name], MD  [facility] [address] [place], [place] [address]  Phone  [phone] Fax [phone]  I spent 30 minutes of face-to-face and non-face-to-face time with patient on the  1. Musculoskeletal pain   2. Cramps, extremity   3. Chronic bilateral low back pain, unspecified whether sciatica present    diagnosis.  This included previsit chart review, lab review, study review, order entry, electronic health record documentation, patient education on the different diagnostic and therapeutic options, counseling and coordination of care, risks and benefits of management, compliance, or risk factor reduction

## 2023-02-13 NOTE — Patient Instructions (Addendum)
6 days of steroids to help with physical symptoms Increase zoloft to 150 then 200 Temporary anxiety, insomnia clonazepam temporarily   Meds ordered this encounter  Medications   methylPREDNISolone (MEDROL DOSEPAK) 4 MG TBPK tablet    Sig: Take pills daily all together with food. Take the first dose (6 pills) as soon as possible. Take the rest each morning. For 6 days total 6-5-4-3-2-1.    Dispense:  21 tablet    Refill:  1   sertraline (ZOLOFT) 100 MG tablet    Sig: Take 1.5 (150mg ) pills daily for one week thn increase to 2 pills (200mg ) daily    Dispense:  180 tablet    Refill:  4   clonazePAM (KLONOPIN) 0.5 MG tablet    Sig: At bedtime take 1 -2 tabs for insomnia or anxiety (0.5mg -1mg ). During the day for panic attacks try 1/2 tablet up to 2 addiitonal times a day(0.25mg )    Dispense:  90 tablet    Refill:  1     Methylprednisolone Tablets What is this medication? METHYLPREDNISOLONE (meth ill pred NISS oh lone) treats many conditions such as asthma, allergic reactions, arthritis, inflammatory bowel diseases, adrenal, and blood or bone marrow disorders. It works by decreasing inflammation, slowing down an overactive immune system, or replacing cortisol normally made in the body. Cortisol is a hormone that plays an important role in how the body responds to stress, illness, and injury. It belongs to a group of medications called steroids. This medicine may be used for other purposes; ask your health care provider or pharmacist if you have questions. COMMON BRAND NAME(S): Medrol, Medrol Dosepak What should I tell my care team before I take this medication? They need to know if you have any of these conditions: Cushing's syndrome Eye disease, vision problems Diabetes Glaucoma Heart disease High blood pressure Infection especially a viral infection, such as chickenpox, cold sores, or herpes Liver disease Mental health conditions Myasthenia gravis Osteoporosis Recent or upcoming  vaccine Seizures Stomach or intestine problems Thyroid disease An unusual or allergic reaction to lactose, methylprednisolone, other medications, foods, dyes, or preservatives Pregnant or trying to get pregnant Breastfeeding How should I use this medication? Take this medication by mouth with a glass of water. Follow the directions on the prescription label. Take this medication with food. If you are taking this medication once a day, take it in the morning. Do not take it more often than directed. Do not suddenly stop taking your medication because you may develop a severe reaction. Your care team will tell you how much medication to take. If your care team wants you to stop the medication, the dose may be slowly lowered over time to avoid any side effects. Talk to your care team about the use of this medication in children. Special care may be needed. Overdosage: If you think you have taken too much of this medicine contact a poison control center or emergency room at once. NOTE: This medicine is only for you. Do not share this medicine with others. What if I miss a dose? If you miss a dose, take it as soon as you can. If it is almost time for your next dose, talk to your care team. You may need to miss a dose or take an extra dose. Do not take double or extra doses without advice. What may interact with this medication? Do not take this medication with any of the following: Alefacept Echinacea Live virus vaccines Metyrapone Mifepristone This medication may also  interact with the following: Amphotericin B Aspirin and aspirin-like medications Certain antibiotics, such as erythromycin, clarithromycin, troleandomycin Certain medications for diabetes Certain medications for fungal infections, such as ketoconazole Certain medications for seizures, such as carbamazepine, phenobarbital, phenytoin Certain medications that treat or prevent blood clots, such as  warfarin Cholestyramine Cyclosporine Digoxin Diuretics Estrogen or progestin hormones Isoniazid NSAIDs, medications for pain and inflammation, such as ibuprofen or naproxen Other medications for myasthenia gravis Rifampin Vaccines This list may not describe all possible interactions. Give your health care provider a list of all the medicines, herbs, non-prescription drugs, or dietary supplements you use. Also tell them if you smoke, drink alcohol, or use illegal drugs. Some items may interact with your medicine. What should I watch for while using this medication? Tell your care team if your symptoms do not start to get better or if they get worse. Do not stop taking except on your care team's advice. You may develop a severe reaction. Your care team will tell you how much medication to take. This medication may increase your risk of getting an infection. Tell your care team if you are around anyone with measles or chickenpox, or if you develop sores or blisters that do not heal properly. This medication may increase blood sugar levels. Ask your care team if changes in diet or medications are needed if you have diabetes. Tell your care team right away if you have any change in your eyesight. Using this medication for a long time may increase your risk of low bone mass. Talk to your care team about bone health. What side effects may I notice from receiving this medication? Side effects that you should report to your care team as soon as possible: Allergic reactions--skin rash, itching, hives, swelling of the face, lips, tongue, or throat Cushing syndrome--increased fat around the midsection, upper back, neck, or face, pink or purple stretch marks on the skin, thinning, fragile skin that easily bruises, unexpected hair growth High blood sugar (hyperglycemia)--increased thirst or amount of urine, unusual weakness or fatigue, blurry vision Increase in blood pressure Infection--fever, chills,  cough, sore throat, wounds that don't heal, pain or trouble when passing urine, general feeling of discomfort or being unwell Low adrenal gland function--nausea, vomiting, loss of appetite, unusual weakness or fatigue, dizziness Mood and behavior changes--anxiety, nervousness, confusion, hallucinations, irritability, hostility, thoughts of suicide or self-harm, worsening mood, feelings of depression Stomach bleeding--bloody or black, tar-like stools, vomiting blood or brown material that looks like coffee grounds Swelling of the ankles, hands, or feet Side effects that usually do not require medical attention (report to your care team if they continue or are bothersome): Acne General discomfort and fatigue Headache Increase in appetite Nausea Trouble sleeping Weight gain This list may not describe all possible side effects. Call your doctor for medical advice about side effects. You may report side effects to FDA at 1-800-FDA-1088. Where should I keep my medication? Keep out of the reach of children and pets. Store at room temperature between 20 and 25 degrees C (68 and 77 degrees F). Throw away any unused medication after the expiration date. NOTE: This sheet is a summary. It may not cover all possible information. If you have questions about this medicine, talk to your doctor, pharmacist, or health care provider.  2024 Elsevier/Gold Standard (2021-08-30 00:00:00)

## 2023-02-20 ENCOUNTER — Ambulatory Visit: Payer: No Typology Code available for payment source | Admitting: Neurology

## 2023-04-17 ENCOUNTER — Ambulatory Visit
Admission: RE | Admit: 2023-04-17 | Discharge: 2023-04-17 | Disposition: A | Discharge status: Home / Self Care | Payer: Self-pay | Source: Ambulatory Visit | Attending: Family Medicine | Admitting: Family Medicine

## 2023-04-17 DIAGNOSIS — Z87891 Personal history of nicotine dependence: Secondary | ICD-10-CM

## 2023-04-24 ENCOUNTER — Other Ambulatory Visit: Payer: Self-pay | Admitting: Endocrinology

## 2023-04-24 DIAGNOSIS — E041 Nontoxic single thyroid nodule: Secondary | ICD-10-CM

## 2023-04-30 ENCOUNTER — Inpatient Hospital Stay: Admission: RE | Admit: 2023-04-30 | Payer: Self-pay | Source: Ambulatory Visit

## 2023-04-30 ENCOUNTER — Ambulatory Visit
Admission: RE | Admit: 2023-04-30 | Discharge: 2023-04-30 | Disposition: A | Discharge status: Home / Self Care | Payer: Self-pay | Source: Ambulatory Visit | Attending: Endocrinology | Admitting: Endocrinology

## 2023-04-30 DIAGNOSIS — E041 Nontoxic single thyroid nodule: Secondary | ICD-10-CM

## 2023-05-07 ENCOUNTER — Other Ambulatory Visit: Payer: Self-pay | Admitting: Neurology

## 2023-05-07 ENCOUNTER — Other Ambulatory Visit: Payer: Self-pay

## 2023-05-08 ENCOUNTER — Other Ambulatory Visit: Payer: Self-pay | Admitting: Endocrinology

## 2023-05-08 DIAGNOSIS — E041 Nontoxic single thyroid nodule: Secondary | ICD-10-CM

## 2023-05-09 ENCOUNTER — Other Ambulatory Visit: Payer: Self-pay | Admitting: Endocrinology

## 2023-05-09 DIAGNOSIS — E041 Nontoxic single thyroid nodule: Secondary | ICD-10-CM

## 2023-05-20 NOTE — Telephone Encounter (Signed)
 I marked as refill requested too soon.

## 2023-05-20 NOTE — Telephone Encounter (Signed)
 Last visit 02/13/23 Next visit not scheduled yet  Last fills:   I called pharmacy. Patient just filled this on 05/07/23 so its still too early and Dr Tresia Fruit had only given this as temporary.   ----------- Per last visit:

## 2023-05-24 ENCOUNTER — Ambulatory Visit
Admission: RE | Admit: 2023-05-24 | Discharge: 2023-05-24 | Disposition: A | Discharge status: Home / Self Care | Payer: Self-pay | Source: Ambulatory Visit | Attending: Endocrinology | Admitting: Endocrinology

## 2023-05-24 ENCOUNTER — Other Ambulatory Visit (HOSPITAL_COMMUNITY)
Admission: RE | Admit: 2023-05-24 | Discharge: 2023-05-24 | Disposition: A | Discharge status: Home / Self Care | Payer: Self-pay | Source: Ambulatory Visit | Attending: Endocrinology | Admitting: Endocrinology

## 2023-05-24 DIAGNOSIS — E041 Nontoxic single thyroid nodule: Secondary | ICD-10-CM

## 2023-05-27 LAB — CYTOLOGY - NON PAP

## 2023-06-11 ENCOUNTER — Other Ambulatory Visit: Payer: Self-pay | Admitting: Family Medicine

## 2023-06-11 ENCOUNTER — Other Ambulatory Visit

## 2023-06-11 ENCOUNTER — Ambulatory Visit
Admission: RE | Admit: 2023-06-11 | Discharge: 2023-06-11 | Disposition: A | Discharge status: Home / Self Care | Source: Ambulatory Visit | Attending: Family Medicine | Admitting: Family Medicine

## 2023-06-11 DIAGNOSIS — N200 Calculus of kidney: Secondary | ICD-10-CM

## 2023-06-15 ENCOUNTER — Telehealth: Payer: Self-pay

## 2023-06-15 MED ORDER — KETOROLAC TROMETHAMINE 10 MG PO TABS: 10.0000 mg | ORAL_TABLET | Freq: Four times a day (QID) | ORAL | 0 refills | Status: AC | PRN

## 2023-06-15 NOTE — Telephone Encounter (Signed)
 Sending in rx for toradol for kidney stone pain.

## 2023-07-08 NOTE — Telephone Encounter (Signed)
 Please see the MyChart message reply(ies) for my assessment and plan.    This patient gave consent for this Medical Advice Message and is aware that it may result in a bill to [employer], as well as the possibility of receiving a bill for a co-payment or deductible. They are an established patient, but are not seeking medical advice exclusively about a problem treated during an in person or video visit in the last seven days. I did not recommend an in person or video visit within seven days of my reply.    I spent a total of 5 minutes cumulative time within 7 days through [employer].  Anson Fret, MD

## 2023-07-11 ENCOUNTER — Other Ambulatory Visit: Payer: Self-pay | Admitting: Neurology

## 2023-07-11 DIAGNOSIS — M7918 Myalgia, other site: Secondary | ICD-10-CM

## 2023-07-11 DIAGNOSIS — M545 Other chronic pain: Secondary | ICD-10-CM

## 2023-07-11 DIAGNOSIS — R252 Cramp and spasm: Secondary | ICD-10-CM

## 2023-07-11 MED ORDER — PREGABALIN 150 MG PO CAPS: 150.0000 mg | ORAL_CAPSULE | Freq: Two times a day (BID) | ORAL | 6 refills | Status: AC

## 2023-07-11 MED ORDER — METHYLPREDNISOLONE 4 MG PO TBPK
ORAL_TABLET | ORAL | 1 refills | Status: DC
Start: 2023-07-11 — End: 2023-09-09

## 2023-07-15 ENCOUNTER — Other Ambulatory Visit: Payer: Self-pay | Admitting: Neurology

## 2023-07-15 DIAGNOSIS — R200 Anesthesia of skin: Secondary | ICD-10-CM

## 2023-07-15 DIAGNOSIS — M5416 Radiculopathy, lumbar region: Secondary | ICD-10-CM

## 2023-07-29 ENCOUNTER — Encounter: Payer: Self-pay | Admitting: Neurology

## 2023-08-07 ENCOUNTER — Telehealth: Payer: Self-pay | Admitting: Neurology

## 2023-08-07 NOTE — Telephone Encounter (Signed)
 I'm trying ot get patient in sooner for emg/ncs. The only thing I can try to do is on July 30th, if we can move the 330 pm to 3pm then I can add this patient for emg/ncs at 330. Would you see if the 330 can move sooner so that I can get this emg/ncs done at the end of the day please? Thank you!

## 2023-08-08 NOTE — Telephone Encounter (Signed)
 Adjusted Dr. Sharion schedule and rescheduled patient's ncs/emg to 08/14/23 at 3:30pm

## 2023-08-14 ENCOUNTER — Encounter: Payer: Self-pay | Admitting: Neurology

## 2023-08-14 ENCOUNTER — Ambulatory Visit (INDEPENDENT_AMBULATORY_CARE_PROVIDER_SITE_OTHER): Payer: Self-pay | Admitting: Neurology

## 2023-08-14 DIAGNOSIS — M5416 Radiculopathy, lumbar region: Secondary | ICD-10-CM

## 2023-08-14 DIAGNOSIS — R200 Anesthesia of skin: Secondary | ICD-10-CM

## 2023-08-20 ENCOUNTER — Other Ambulatory Visit: Payer: Self-pay | Admitting: Neurology

## 2023-08-20 ENCOUNTER — Encounter: Payer: Self-pay | Admitting: Neurology

## 2023-08-20 DIAGNOSIS — G5730 Lesion of lateral popliteal nerve, unspecified lower limb: Secondary | ICD-10-CM

## 2023-08-20 NOTE — Procedures (Signed)
 Full Name: Sheena Gray Gender: Female MRN #: [id number] Date of Birth: 01/21/1965    Visit Date: 08/14/2023 15:23 Age: 58 Years  History: Patient with > 5 years of lower leg paresthesias. 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 With conservative measures she improved but now worsening. Recently repeated MRI lumbar spine was unremarkable without etiology for her symptoms. Left leg is worse symptomatically.   Summary: EMG/NCS was performed on the bilateral lower extremities.  The right peroneal motor nerve(EDB) showed reduced amplitude(0.58mv, N>2) and decreased conduction velocity(fib head to ankle, 42m/s, N>44). The left peroneal motor nerve(EDB) showed delayed distal onset latency(8.62ms, N<6.5) and reduced amplitude(1.0 mV, N>2). The right peroneal motor nerve(Tib Ant) showed delayed distal onset latency(5.64ms, N<4.7) and reduced amplitude(0.64mV, N>3). The right superficial peroneal sensory nerve showed reduced amplitude (4uV, N>6). The left superficial peroneal sensory nerve showed reduced amplitude (3uV, N>6). All remaining nerves (as indicated in the following tables) were within normal limits. All muscles (as indicated in the following tables) were within normal limits.     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 Cannot localize based on the nerve conductions or EMG needle exam. No evidence of lumbar radiculopathy, polyneuropathy or muscle disorder. This is consistent with emg/ncs performed 01/01/2019.      ------------------------------- [name], M.D.  [facility] [address], [address] [place], [place] [address] Tel: [phone] Fax: [phone]  Verbal informed consent was obtained from the patient, patient was informed of potential risk of procedure, including bruising, bleeding, hematoma  formation, infection, muscle weakness, muscle pain, numbness, among others.        MNC    Nerve / Sites Muscle Latency Ref. Amplitude Ref. Rel Amp Segments Distance Velocity Ref. Area    ms ms mV mV %  cm m/s m/s mVms  R Peroneal - EDB     Ankle EDB 6.5 <=6.5 0.5 >=2.0 100 Ankle - EDB 7   1.4     Fib head EDB 13.5  0.8  162 Fib head - Ankle 30 43 >=44 1.1     Pop fossa EDB 15.4  0.9  117 Pop fossa - Fib head 9.5 49 >=44          Pop fossa - Ankle      L Peroneal - EDB     Ankle EDB 8.4 <=6.5 1.0 >=2.0 100 Ankle - EDB 7   1.8     Fib head EDB 14.6  1.1  109 Fib head - Ankle 28 45 >=44 1.5     Pop fossa EDB 16.6  1.1  105 Pop fossa - Fib head 10 51 >=44 1.7         Pop fossa - Ankle      R Tibial - AH     Ankle AH 4.4 <=5.8 11.9 >=4.0 100 Ankle - AH 9   33.8     Pop fossa AH 13.0  8.5  71.8 Pop fossa - Ankle 35 41 >=41 27.2  L Tibial - AH     Ankle AH 3.9 <=5.8 11.7 >=4.0 100 Ankle - AH 11   34.9     Pop fossa AH 12.1  10.1  85.9 Pop fossa - Ankle 35 42 >=41 40.5  R Peroneal - Tib Ant     Fib Head Tib Ant 5.2 <=4.7 0.2 >=3.0 100 Fib Head - Tib Ant 10   2.2  Pop fossa Tib Ant NR  NR  NR Pop fossa - Fib Head 11 NR >=44 NR                 SNC    Nerve / Sites Rec. Site Peak Lat Ref.  Amp Ref. Segments Distance    ms ms V V  cm  R Sural - Ankle (Calf)     Calf Ankle 3.3 <=4.4 6 >=6 Calf - Ankle 14  L Sural - Ankle (Calf)     Calf Ankle 3.2 <=4.4 9 >=6 Calf - Ankle 14  R Superficial peroneal - Ankle     Lat leg Ankle 2.3 <=4.4 4 >=6 Lat leg - Ankle 14  L Superficial peroneal - Ankle     Lat leg Ankle 2.0 <=4.4 3 >=6 Lat leg - Ankle 14             F  Wave    Nerve F Lat Ref.   ms ms  L Tibial - AH 52.1 <=56.0       EMG Summary Table    Spontaneous MUAP Recruitment  Muscle IA Fib PSW Fasc Other Amp Dur. Poly Pattern  L. Vastus medialis Normal None None None _______ Normal Normal Normal Normal  L. Peroneus longus Normal None None None _______ Normal Normal Normal Normal   L. Gastrocnemius (Medial head) Normal None None None _______ Normal Normal Normal Normal  L. Extensor hallucis longus Normal None None None _______ Normal Normal Normal Normal  L. Pronator teres Normal None None None _______ Normal Normal Normal Normal  L. Biceps femoris (long head) Normal None None None _______ Normal Normal Normal Normal  L. Biceps femoris (short head) Normal None None None _______ Normal Normal Normal Normal  L. Gluteus maximus Normal None None None _______ Normal Normal Normal Normal  L. Gluteus medius Normal None None None _______ Normal Normal Normal Normal  L. Lumbar paraspinals (low) Normal None None None _______ Normal Normal Normal Normal

## 2023-08-20 NOTE — Progress Notes (Signed)
 Full Name: Sheena Gray Gender: Female MRN #: [id number] Date of Birth: 01/21/1965    Visit Date: 08/14/2023 15:23 Age: 58 Years  History: Patient with > 5 years of lower leg paresthesias. 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 With conservative measures she improved but now worsening. Recently repeated MRI lumbar spine was unremarkable without etiology for her symptoms. Left leg is worse symptomatically.   Summary: EMG/NCS was performed on the bilateral lower extremities.  The right peroneal motor nerve(EDB) showed reduced amplitude(0.58mv, N>2) and decreased conduction velocity(fib head to ankle, 42m/s, N>44). The left peroneal motor nerve(EDB) showed delayed distal onset latency(8.62ms, N<6.5) and reduced amplitude(1.0 mV, N>2). The right peroneal motor nerve(Tib Ant) showed delayed distal onset latency(5.64ms, N<4.7) and reduced amplitude(0.64mV, N>3). The right superficial peroneal sensory nerve showed reduced amplitude (4uV, N>6). The left superficial peroneal sensory nerve showed reduced amplitude (3uV, N>6). All remaining nerves (as indicated in the following tables) were within normal limits. All muscles (as indicated in the following tables) were within normal limits.     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 Cannot localize based on the nerve conductions or EMG needle exam. No evidence of lumbar radiculopathy, polyneuropathy or muscle disorder. This is consistent with emg/ncs performed 01/01/2019.      ------------------------------- Sheena Gray, M.D.  [facility] [address], [address] [place], [place] [address] Tel: [phone] Fax: [phone]  Verbal informed consent was obtained from the patient, patient was informed of potential risk of procedure, including bruising, bleeding, hematoma  formation, infection, muscle weakness, muscle pain, numbness, among others.        MNC    Nerve / Sites Muscle Latency Ref. Amplitude Ref. Rel Amp Segments Distance Velocity Ref. Area    ms ms mV mV %  cm m/s m/s mVms  R Peroneal - EDB     Ankle EDB 6.5 <=6.5 0.5 >=2.0 100 Ankle - EDB 7   1.4     Fib head EDB 13.5  0.8  162 Fib head - Ankle 30 43 >=44 1.1     Pop fossa EDB 15.4  0.9  117 Pop fossa - Fib head 9.5 49 >=44          Pop fossa - Ankle      L Peroneal - EDB     Ankle EDB 8.4 <=6.5 1.0 >=2.0 100 Ankle - EDB 7   1.8     Fib head EDB 14.6  1.1  109 Fib head - Ankle 28 45 >=44 1.5     Pop fossa EDB 16.6  1.1  105 Pop fossa - Fib head 10 51 >=44 1.7         Pop fossa - Ankle      R Tibial - AH     Ankle AH 4.4 <=5.8 11.9 >=4.0 100 Ankle - AH 9   33.8     Pop fossa AH 13.0  8.5  71.8 Pop fossa - Ankle 35 41 >=41 27.2  L Tibial - AH     Ankle AH 3.9 <=5.8 11.7 >=4.0 100 Ankle - AH 11   34.9     Pop fossa AH 12.1  10.1  85.9 Pop fossa - Ankle 35 42 >=41 40.5  R Peroneal - Tib Ant     Fib Head Tib Ant 5.2 <=4.7 0.2 >=3.0 100 Fib Head - Tib Ant 10   2.2  Pop fossa Tib Ant NR  NR  NR Pop fossa - Fib Head 11 NR >=44 NR                 SNC    Nerve / Sites Rec. Site Peak Lat Ref.  Amp Ref. Segments Distance    ms ms V V  cm  R Sural - Ankle (Calf)     Calf Ankle 3.3 <=4.4 6 >=6 Calf - Ankle 14  L Sural - Ankle (Calf)     Calf Ankle 3.2 <=4.4 9 >=6 Calf - Ankle 14  R Superficial peroneal - Ankle     Lat leg Ankle 2.3 <=4.4 4 >=6 Lat leg - Ankle 14  L Superficial peroneal - Ankle     Lat leg Ankle 2.0 <=4.4 3 >=6 Lat leg - Ankle 14             F  Wave    Nerve F Lat Ref.   ms ms  L Tibial - AH 52.1 <=56.0       EMG Summary Table    Spontaneous MUAP Recruitment  Muscle IA Fib PSW Fasc Other Amp Dur. Poly Pattern  L. Vastus medialis Normal None None None _______ Normal Normal Normal Normal  L. Peroneus longus Normal None None None _______ Normal Normal Normal Normal   L. Gastrocnemius (Medial head) Normal None None None _______ Normal Normal Normal Normal  L. Extensor hallucis longus Normal None None None _______ Normal Normal Normal Normal  L. Pronator teres Normal None None None _______ Normal Normal Normal Normal  L. Biceps femoris (long head) Normal None None None _______ Normal Normal Normal Normal  L. Biceps femoris (short head) Normal None None None _______ Normal Normal Normal Normal  L. Gluteus maximus Normal None None None _______ Normal Normal Normal Normal  L. Gluteus medius Normal None None None _______ Normal Normal Normal Normal  L. Lumbar paraspinals (low) Normal None None None _______ Normal Normal Normal Normal

## 2023-08-24 ENCOUNTER — Encounter: Payer: Self-pay | Admitting: Neurosurgery

## 2023-08-26 NOTE — Telephone Encounter (Signed)
 Done

## 2023-08-28 ENCOUNTER — Telehealth: Payer: Self-pay | Admitting: Neurology

## 2023-08-28 ENCOUNTER — Other Ambulatory Visit: Payer: Self-pay | Admitting: Neurology

## 2023-08-28 DIAGNOSIS — G5780 Other specified mononeuropathies of unspecified lower limb: Secondary | ICD-10-CM

## 2023-08-28 NOTE — Telephone Encounter (Signed)
 Patient said Dr. Ines had said will need xrays of knees prior to appointment with Neurosurgery. Would like a call back to verify if I need to take care of that or will that be scheduled through your office. Can respond to the MyChart if you would like.

## 2023-08-28 NOTE — Telephone Encounter (Signed)
 Ordered thank you

## 2023-08-30 ENCOUNTER — Ambulatory Visit
Admission: RE | Admit: 2023-08-30 | Discharge: 2023-08-30 | Disposition: A | Discharge status: Home / Self Care | Source: Ambulatory Visit | Attending: Neurology | Admitting: Neurology

## 2023-08-30 DIAGNOSIS — G5780 Other specified mononeuropathies of unspecified lower limb: Secondary | ICD-10-CM

## 2023-09-03 NOTE — Progress Notes (Unsigned)
 Referring Physician:  Ines Onetha NOVAK, MD [address] [place],  [place] [address]  Primary Physician:  Hughie Sharper, MD  History of Present Illness: 09/09/23 [name] is here today with a chief complaint of bilateral peroneal neuropathy.  She also has a history of back pain that radiates down her bilateral legs to her toes with numbness and tingling.  She does still get some intermittent back pain, this is been bothering her for many years.  She has had significant workup including neurology follow-up evaluation, was found to have bilateral peroneal neuropathies on nerve conduction studies/EMGs.  She continues to work with physical therapy.  She did have some time with symptomatic relief, however did have a flare and has been struggling with her symptomatology since 2023.  She also feels like it has been getting worse over the past year.  She has been on Klonopin  and Lyrica  for nerve pain and pain related anxiety.  She does feel a burning sensation in her legs and feels like she is constantly dealing with shinsplints.  She often gets buttocks pain all the way down to the top of her toes.  She also feels like the bottom of her foot is affected.  Review of Systems:  A 10 point review of systems is negative, except for the pertinent positives and negatives detailed in the HPI.  Past Medical History: Past Medical History:  Diagnosis Date   Anxiety    Chronic pain    Common peroneal nerve dysfunction, initial encounter 01/29/2019   Depression    Gallstones    Kidney stones    Osteopenia    Osteoporosis    Peroneal mononeuropathy     Past Surgical History: Past Surgical History:  Procedure Laterality Date   CHOLECYSTECTOMY     LITHOTRIPSY     SHOULDER ARTHROSCOPY  2015    Allergies: Allergies as of 09/09/2023 - Review Complete 08/14/2023  Allergen Reaction Noted   Shellfish allergy Nausea And Vomiting 11/26/2018    Medications:  Current Outpatient  Medications:    baclofen (LIORESAL) 10 MG tablet, Take 10 mg by mouth at bedtime. (Patient taking differently: Take 10 mg by mouth as needed.), Disp: , Rfl:    clonazePAM  (KLONOPIN ) 0.5 MG tablet, At bedtime take 1 -2 tabs for insomnia or anxiety (0.5mg -1mg ). During the day for panic attacks try 1/2 tablet up to 2 addiitonal times a day(0.25mg ) (Patient taking differently: Take 0.25 mg by mouth at bedtime as needed. At bedtime take 1 -2 tabs for insomnia or anxiety (0.5mg -1mg ). During the day for panic attacks try 1/2 tablet up to 2 addiitonal times a day(0.25mg )), Disp: 90 tablet, Rfl: 1   Melatonin 10 MG CAPS, Take 1 capsule by mouth at bedtime., Disp: , Rfl:    methylPREDNISolone  (MEDROL  DOSEPAK) 4 MG TBPK tablet, Take pills daily all together with food. Take the first dose (6 pills) as soon as possible. Take the rest each morning. For 6 days total 6-5-4-3-2-1. (Patient not taking: Reported on 08/14/2023), Disp: 21 tablet, Rfl: 1   Multiple Vitamins-Minerals (CENTRUM SILVER 50+WOMEN PO), Take 1 tablet by mouth daily., Disp: , Rfl:    pregabalin  (LYRICA ) 150 MG capsule, Take 1 capsule (150 mg total) by mouth 2 (two) times daily. (Patient taking differently: Take 150 mg by mouth daily.), Disp: 60 capsule, Rfl: 6   sertraline  (ZOLOFT ) 100 MG tablet, Take 1.5 (150mg ) pills daily for one week thn increase to 2 pills (200mg ) daily (Patient taking differently: Take  100 mg by mouth daily. Take 1.5 (150mg ) pills daily for one week thn increase to 2 pills (200mg ) daily), Disp: 180 tablet, Rfl: 4   TESTOSTERONE TD, Place 0.25 mg onto the skin daily., Disp: , Rfl:    UNABLE TO FIND, Biest .25 mg BHRT topical, Disp: , Rfl:    UNABLE TO FIND, 1 each at bedtime. Med Name: Strontium Citrate, Disp: , Rfl:    UNABLE TO FIND, Med Name: Osteo Strong Vitamins, Disp: , Rfl:    UNABLE TO FIND, Take 140 mg by mouth daily. Med Name: Progesterone SR, Disp: , Rfl:   Social History: Social History   Tobacco Use   Smoking  status: Former    Current packs/day: 0.00    Types: Cigarettes    Start date: 01/20/1994    Quit date: 01/21/1999    Years since quitting: 24.6   Smokeless tobacco: Never   Tobacco comments:    teenage and college years  Vaping Use   Vaping status: Never Used  Substance Use Topics   Alcohol use: Not Currently    Comment: no   Drug use: Never    Comment: no    Family Medical History: Family History  Problem Relation Age of Onset   Breast cancer Mother    Osteoporosis Mother    Asthma Mother    Diabetes Mother    Heart disease Father    Heart attack Father 86   Diabetes Other    Colon cancer Neg Hx    Colon polyps Neg Hx    Esophageal cancer Neg Hx    Stomach cancer Neg Hx    Rectal cancer Neg Hx     Physical Examination:   NEUROLOGICAL:     Awake, alert, oriented to person, place, and time.  Speech is clear and fluent.   Cranial Nerves: Pupils equal round and reactive to light.  Facial tone is symmetric. Shoulder shrug is symmetric. Tongue protrusion is midline.  There is no pronator drift.  Motor Exam:  She does not show any major motor deficits, does not show any major motor wasting.  No anterior compartment wasting.   Medical Decision Making  Electrodiagnostics:         Full Name:      Sheena Gray   Gender:          Female MRN #:            [id number]      Date of Birth: 07-15-1965    Visit Date:      08/14/2023 15:23 Age:    58 Years   History: Patient with > 5 years of lower leg paresthesias. 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 With conservative measures she improved but now worsening. Recently repeated MRI lumbar spine was unremarkable without etiology for her symptoms. Left leg is worse symptomatically.    Summary: EMG/NCS was performed on the bilateral lower extremities.  The right peroneal motor nerve(EDB) showed reduced amplitude(0.27mv,  N>2) and decreased conduction velocity(fib head to ankle, 60m/s, N>44). The left peroneal motor nerve(EDB) showed delayed distal onset latency(8.77ms, N<6.5) and reduced amplitude(1.0 mV, N>2). The right peroneal motor nerve(Tib Ant) showed delayed distal onset latency(5.76ms, N<4.7) and reduced amplitude(0.32mV, N>3). The right superficial peroneal sensory nerve showed reduced amplitude (4uV, N>6). The left superficial peroneal sensory nerve showed reduced amplitude (3uV, N>6). All remaining nerves (as indicated in the following tables) were within normal limits. All muscles (as  indicated in the following tables) were within normal limits.     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 Cannot localize based on the nerve conductions or EMG needle exam. No evidence of lumbar radiculopathy, polyneuropathy or muscle disorder. This is consistent with emg/ncs performed 01/01/2019.          ------------------------------- [name], M.D.   [facility] [address] [place], [place] [address] Tel: [phone] Fax: [phone]   Verbal informed consent was obtained from the patient, patient was informed of potential risk of procedure, including bruising, bleeding, hematoma formation, infection, muscle weakness, muscle pain, numbness, among others.  I have personally reviewed the images and electrodiagnostics and agree with the above interpretation.  Assessment and Plan: Sheena Gray is a pleasant 58 y.o. female with bilateral peroneal neuropathy.  She was referred to me for evaluation.  She has been followed for multiple years in our neurology department.  She has had intermittent flares of back and lower extremity pain.  She has been worked up by [employer] and has had multiple different types of therapeutics and manual therapy.  Her EMG does demonstrate bilateral peroneal neuropathy mostly based off of her nerve  conduction studies, no active denervation noted.  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  However she also does have back pain and states that the bottom of her feet specifically are also bothering her.  This does not clearly localize to the peroneal nerve itself.  There are some patients who develop peroneal intraneural cysts which can spread into the sciatic distribution due to propagation of the cyst.  She is currently having a pelvis MRI scheduled to evaluate the sciatic nerve at the sciatic notch, given her peroneal predominant symptoms I would also like to have her get bilateral knee MRIs to evaluate for any intraneural peroneal cyst formation.  These orders have been placed.  I like to follow-up with her after her imaging.  The symptoms are causing a significant impact on the patient's life. I have utilized the care everywhere function in epic to review the outside records available from external health systems, and have personally reviewed relevant imaging and electrodiagnostic workup.    Thank you for involving me in the care of this patient.    Penne MICAEL Sharps MD/MSCR Neurosurgery - Peripheral Nerve Surgery

## 2023-09-04 ENCOUNTER — Telehealth: Payer: Self-pay | Admitting: Orthopedic Surgery

## 2023-09-04 NOTE — Telephone Encounter (Signed)
 Patient called and said her doctor said to called and see if you could push DRI to get the images for Friday's appointment. Also, she also wants to talk to you about the appointment for Friday. CB#[phone]

## 2023-09-04 NOTE — Telephone Encounter (Signed)
 Patient called and said she has a few questions to make sure Addie does exactly what she needs. CB#[phone]

## 2023-09-04 NOTE — Telephone Encounter (Signed)
 I called [facility] reading room, spoke to stacy who will relay to radiologists to read the knee xrays for us .

## 2023-09-04 NOTE — Telephone Encounter (Signed)
 IC wanting to verify we can see her images of her knees in preparation for Friday's appt

## 2023-09-05 ENCOUNTER — Ambulatory Visit: Payer: Self-pay | Admitting: Neurology

## 2023-09-06 ENCOUNTER — Ambulatory Visit (INDEPENDENT_AMBULATORY_CARE_PROVIDER_SITE_OTHER): Payer: Self-pay | Admitting: Orthopedic Surgery

## 2023-09-06 ENCOUNTER — Encounter: Payer: Self-pay | Admitting: Orthopedic Surgery

## 2023-09-06 DIAGNOSIS — M79605 Pain in left leg: Secondary | ICD-10-CM

## 2023-09-06 DIAGNOSIS — R202 Paresthesia of skin: Secondary | ICD-10-CM

## 2023-09-06 NOTE — Progress Notes (Signed)
 Office Visit Note   Patient: Sheena Gray           Date of Birth: 1965/02/02           MRN: [id number] Visit Date: 09/06/2023 Requested by: Hughie Sharper, MD [address] [place],  [place] [address] PCP: Hughie Sharper, MD  Subjective: Chief Complaint  Patient presents with   Other    Bilateral leg pain with numbness/tingling/burning sensations    HPI: PAHOLA Gray is a 58 y.o. female who presents to the office reporting long history of bilateral leg pain.  Patient has had EMG nerve studies done in 2020 as well as 2025.  MRI scan of the lumbar spine has been performed which shows no compression.  She is also had radiographs done of the knees which were unremarkable.  Nerve studies do show bilateral peroneal nerve compression.  She denies any recent injury.  Pain has been ongoing since 2000 and she would occasionally have flares that would go away without too much fan fair.  This current episode has been going on for 9 months.  Pain wakes her from sleep at night if she can even go to sleep.  She does report numbness and tingling on both the dorsal and plantar aspect of both feet.  Reports burning with numbness tingling and paresthesias but no weakness.  Is a sharp stabbing pain that she has in her back on that left-hand side radiating down the leg.  Lyrica  does not give her any relief.  Klonopin  also not helpful for sleep.  She does report some low back pain but has no prior history of surgery.  When this first happened in 2020 physical therapy made it better.  1 year ago she had a flareup with physical therapy helping at that time.  Dry needling by physical therapy during part of that actually made it worse.  Gabapentin not helpful.  She is seeing a neurosurgeon Dr. Claudene on Monday who has a specialty in peroneal and peripheral neuropathy..                ROS: All systems reviewed are negative as they relate to the chief complaint within the history of present illness.  Patient  denies fevers or chills.  Assessment & Plan: Visit Diagnoses:  1. Paresthesia of skin   2. Pain in left leg     Plan: Impression is objective peroneal neuropathy both legs left more symptomatic than right clinically.  No weakness on exam.  Does have some sciatic notch tenderness on the left-hand side but no masses palpable in that region.  Symptoms going on both the dorsal and plantar aspect of the foot on the left and right-hand side which is not completely consistent with peroneal neuropathy only.  I think there is a possibility that she may also have some general neuropathy along with peroneal nerve compression on both sides.  It is also possible she could have some sciatic nerve impingement on that left-hand side but it would be unusual for her to have the same type of structural sciatic nerve problems on both sides.  I think if she has peroneal nerve decompression then some of her symptoms would improve.  It is not a given that any symptoms on the plantar aspect of both feet as well as the back pain would really improve.  We will try MRI of the left hip to just evaluate the sciatic nerve at the sciatic notch where she is having a lot  of tenderness.  I think she may want to try trazodone but may not want to stop the Lyrica  cold [place] which she has been on for several weeks.  I think Dr. Claudene would be able to give a very good opinion about expected operative results from peroneal nerve decompression.  Follow-Up Instructions: No follow-ups on file.   Orders:  Orders Placed This Encounter  Procedures   MR Hip Left w/o contrast   No orders of the defined types were placed in this encounter.     Procedures: No procedures performed   Clinical Data: No additional findings.  Objective: Vital Signs: There were no vitals taken for this visit.  Physical Exam:  Constitutional: Patient appears well-developed HEENT:  Head: Normocephalic Eyes:EOM are normal Neck: Normal range of  motion Cardiovascular: Normal rate Pulmonary/chest: Effort normal Neurologic: Patient is alert Skin: Skin is warm Psychiatric: Patient has normal mood and affect  Ortho Exam: Ortho exam demonstrates normal gait and alignment.  Does have sciatic notch tenderness on the left not on the right.  No groin pain with internal/external Tatian of the leg.  Pedal pulses palpable.  Does have some paresthesias on both the dorsal and plantar aspect of both feet and that does extend up but not above the knee.  Knee range of motion is full bilaterally.  5 out of 5 ankle dorsiflexion plantarflexion quad and hamstring strength.  Patient has palpable intact nontender anterior to posterior to peroneal and Achilles tendons with excellent eversion and inversion strength bilaterally.  Specialty Comments:  Electrodiagnostic study of both lower limbs 01/01/2019 Summary: The right peroneal motor nerve showed reduced amplitude(0.57mv, N>2) and decreased conduction velocity(fib head to ankle, 89m/s, N>44)  and decreased conduction velocity(pop fossa to fib head, 23m/s, N>44). The left peroneal motor nerve showed delayed distal onset latency(10.36ms, N<6.5) and reduced amplitude(0.37mv, N>2). The left superficial peroneal sensory nerve showed no response. The right superficial peroneal sensory nerve showed reduced amplitude (3uV, N>6). The right peroneal F wave showed delayed latency(56.3, N<56). The right peroneal F wave showed no response. All remaining nerves (as indicated in the following tables) were within normal limits. All muscles (as indicated in the following tables) were within normal limits.        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 No evidence of lumbar radiculopathy, polyneuropathy or muscle disorder. EMG needle study did not show any acute or ongoing denervation.      [name] M.D.  Imaging: No results found.   PMFS History: Patient  Active Problem List   Diagnosis Date Noted   Abnormal perimenopausal bleeding 03/06/2019   Multiple neurological symptoms 12/01/2018   Chronic back pain 11/26/2017   Lumbar pain 10/16/2017   Microscopic hematuria 11/04/2015   Past Medical History:  Diagnosis Date   Anxiety    Chronic pain    Common peroneal nerve dysfunction, initial encounter 01/29/2019   Depression    Gallstones    Kidney stones    Osteopenia    Osteoporosis    Peroneal mononeuropathy     Family History  Problem Relation Age of Onset   Breast cancer Mother    Osteoporosis Mother    Asthma Mother    Diabetes Mother    Heart disease Father    Heart attack Father 30   Diabetes Other    Colon cancer Neg Hx    Colon polyps Neg Hx    Esophageal cancer Neg Hx    Stomach cancer Neg Hx  Rectal cancer Neg Hx     Past Surgical History:  Procedure Laterality Date   CHOLECYSTECTOMY     LITHOTRIPSY     SHOULDER ARTHROSCOPY  2015   Social History   Occupational History   Not on file  Tobacco Use   Smoking status: Former    Current packs/day: 0.00    Types: Cigarettes    Start date: 01/20/1994    Quit date: 01/21/1999    Years since quitting: 24.6   Smokeless tobacco: Never   Tobacco comments:    teenage and college years  Vaping Use   Vaping status: Never Used  Substance and Sexual Activity   Alcohol use: Not Currently    Comment: no   Drug use: Never    Comment: no   Sexual activity: Yes    Birth control/protection: None

## 2023-09-09 ENCOUNTER — Inpatient Hospital Stay
Admission: RE | Admit: 2023-09-09 | Discharge: 2023-09-09 | Disposition: A | Discharge status: Home / Self Care | Payer: Self-pay | Source: Ambulatory Visit | Attending: Neurosurgery | Admitting: Neurosurgery

## 2023-09-09 ENCOUNTER — Other Ambulatory Visit: Payer: Self-pay | Admitting: Neurology

## 2023-09-09 ENCOUNTER — Ambulatory Visit (INDEPENDENT_AMBULATORY_CARE_PROVIDER_SITE_OTHER): Payer: Self-pay | Admitting: Neurosurgery

## 2023-09-09 ENCOUNTER — Encounter: Payer: Self-pay | Admitting: Neurosurgery

## 2023-09-09 VITALS — BP 114/76 | Ht 66.0 in | Wt 152.0 lb

## 2023-09-09 DIAGNOSIS — Z049 Encounter for examination and observation for unspecified reason: Secondary | ICD-10-CM

## 2023-09-09 DIAGNOSIS — G5733 Lesion of lateral popliteal nerve, bilateral lower limbs: Secondary | ICD-10-CM

## 2023-09-09 DIAGNOSIS — G5732 Lesion of lateral popliteal nerve, left lower limb: Secondary | ICD-10-CM | POA: Insufficient documentation

## 2023-09-09 DIAGNOSIS — G5731 Lesion of lateral popliteal nerve, right lower limb: Secondary | ICD-10-CM | POA: Insufficient documentation

## 2023-09-09 DIAGNOSIS — M549 Dorsalgia, unspecified: Secondary | ICD-10-CM

## 2023-09-09 MED ORDER — TRAZODONE HCL 50 MG PO TABS: 50.0000 mg | ORAL_TABLET | Freq: Every day | ORAL | 11 refills | Status: AC

## 2023-09-12 ENCOUNTER — Encounter: Payer: Self-pay | Admitting: Neurology

## 2023-09-20 ENCOUNTER — Other Ambulatory Visit: Payer: PRIVATE HEALTH INSURANCE

## 2023-09-20 ENCOUNTER — Other Ambulatory Visit: Payer: Self-pay

## 2023-09-27 ENCOUNTER — Other Ambulatory Visit: Payer: Self-pay | Admitting: Neurology

## 2023-09-27 DIAGNOSIS — G8929 Other chronic pain: Secondary | ICD-10-CM

## 2023-09-27 DIAGNOSIS — G5730 Lesion of lateral popliteal nerve, unspecified lower limb: Secondary | ICD-10-CM

## 2023-11-15 NOTE — Therapy (Signed)
 OUTPATIENT PHYSICAL THERAPY NEURO EVALUATION   Patient Name: Sheena Gray MRN: [id number] DOB:05/02/1965, 58 y.o., female Today's Date: 11/18/2023   PCP: Hughie Sharper, MD  REFERRING PROVIDER: Ines Onetha NOVAK, MD  END OF SESSION:  PT End of Session - 11/18/23 1107     Visit Number 1    Number of Visits 13    Date for Recertification  01/06/24    Authorization Type [employer]    Authorization - Visit Number 1    Authorization - Number of Visits 20    PT Start Time [id number]    PT Stop Time 1017    PT Time Calculation (min) 40 min    Activity Tolerance Patient tolerated treatment well    Behavior During Therapy [facility] for tasks assessed/performed          Past Medical History:  Diagnosis Date   Anxiety    Chronic pain    Common peroneal nerve dysfunction, initial encounter 01/29/2019   Depression    Gallstones    Kidney stones    Osteopenia    Osteoporosis    Peroneal mononeuropathy    Past Surgical History:  Procedure Laterality Date   CHOLECYSTECTOMY     LITHOTRIPSY     SHOULDER ARTHROSCOPY  2015   Patient Active Problem List   Diagnosis Date Noted   Neuropathy of right peroneal nerve 09/09/2023   Neuropathy of left peroneal nerve 09/09/2023   Condition not found 09/09/2023   Abnormal perimenopausal bleeding 03/06/2019   Multiple neurological symptoms 12/01/2018   Chronic back pain 11/26/2017   Lumbar pain 10/16/2017   Microscopic hematuria 11/04/2015    ONSET DATE: 27 years   REFERRING DIAG: G57.30 (ICD-10-CM) - Peroneal neuropathy, unspecified laterality M54.42,M54.41,G89.29 (ICD-10-CM) - Chronic bilateral low back pain with bilateral sciatica  THERAPY DIAG:  Other symptoms and signs involving the nervous system  Other low back pain  Muscle weakness (generalized)  Rationale for Evaluation and Treatment: Rehabilitation  SUBJECTIVE:                                                                                                                                                                                              SUBJECTIVE STATEMENT: Patient reports that symptoms were triggered while pregnant 27 years ago. Was not given treatment for this and it has been annoying for 20 years. 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 Later she had a L clavicle injury and started to compensate for this injury, holding her camera in an awkward angle. Had PT in 2020 which helped for 2 years. Notices that it  flares when she is stressed and recently it has flared. Feels that feet are frozen, shin splints, muscles are in spasm. Reports that she was recommended to get another round of MRIs but did not get them d/t cost. Very busy with work right now and reports a lot of life stress. Would like some ideas on how to safely perform strengthen training with her osteoporosis and nerve impingement. Reports occasional L LBP which this is a constant mild pain and occasionally radiating to foot the L foot. No balance changes or red flag symptoms.    Pt accompanied by: self  PERTINENT HISTORY: Anxiety, B peroneal neuropathy, depression, osteoporosis  PAIN:  Are you having pain? Yes: NPRS scale: pt did not score Pain location: anterolateral tibia Pain description: tight, burning Aggravating factors: stress Relieving factors: PT in the past  PRECAUTIONS: Other: osteoporosis  RED FLAGS: None   WEIGHT BEARING RESTRICTIONS: No  FALLS: Has patient fallen in last 6 months? No  LIVING ENVIRONMENT: Lives with: lives with their spouse and 54 y/o mother in social worker Lives in: House/apartment  PLOF: Independent and Vocation/Vocational requirements: hydrologist- requires awkward positions and travel  PATIENT GOALS: improve pain  OBJECTIVE:  Note: Objective measures were completed at Evaluation unless otherwise noted.  DIAGNOSTIC FINDINGS: 08/30/23 R/L knee xray: No acute fracture or dislocation. 2.  Serpiginous sclerotic focus in the distal left femoral metaphysis appears intramedullary measuring 2.5 cm in length. This is favored to represent a benign fibro-osseous lesion such as a bone infarct or enchondroma.  07/15/23 NCS: Nerve conductions show moderately-severe bilateral peroneal mononeuropathies. Cannot localize based on the nerve conductions or EMG needle exam. No evidence of lumbar radiculopathy, polyneuropathy or muscle disorder. This is consistent with emg/ncs performed 01/01/2019.    COGNITION: Overall cognitive status: Within functional limits for tasks assessed   SENSATION: Pt reports intact sensation to light touch in B feet  POSTURE: No Significant postural limitations  PALPATION: TTP in B PSIS, QL, R>L piriformis and proximal glutes   Flexibility: moderate tightness in fig 4 position; normal flexibility in KTOS    LUMBAR ROM:   Active  AROM  eval  Flexion   Extension   Right lateral flexion   Left lateral flexion   Right rotation   Left rotation    (Blank rows = not tested)   LOWER EXTREMITY MMT:     Active  Right Eval Left Eval  Hip flexion 4- 4-  Hip extension 3+ 4  Hip abduction 4- 4-  Hip adduction 4+ 4  Hip internal rotation    Hip external rotation    Knee flexion 4- 4-  Knee extension 5 5  Ankle dorsiflexion 4+ 4+  Ankle plantarflexion 20 reps (more challenging) 20 reps   Ankle inversion 4+ 4+  Ankle eversion 4 4   (Blank rows = not tested)    GAIT: Findings: Assistive device utilized:None, Level of assistance: Complete Independence, and Comments: WFL step through pattern   PATIENT SURVEYS:  Modified Oswestry: 16/50 (32%)  TREATMENT DATE: 11/18/23    PATIENT EDUCATION: Education details: prognosis, POC, edu on exam findings and how they relate to participation impairments, answered pt's questions on  possible link of stress with pain  Person educated: Patient Education method: Explanation Education comprehension: verbalized understanding  HOME EXERCISE PROGRAM: Not yet initiated    GOALS: Goals reviewed with patient? Yes  SHORT TERM GOALS: Target date: 12/09/2023  Patient to be independent with initial HEP. Baseline: HEP initiated Goal status: INITIAL    LONG TERM GOALS: Target date: 01/06/24  Patient to be independent with advanced HEP. Baseline: Not yet initiated  Goal status: INITIAL  Patient to demonstrate B LE strength >/=4+/5.  Baseline: See above Goal status: INITIAL  Patient to report 3 ways to manage pain levels at home. Baseline: not initiaited Goal status: INITIAL  Patient to get connected with a counselor to help manage stress levels which affect pain.  Baseline: not initiated Goal status: INITIAL  Patient to consistently perform strength training 1-2x/week with modifications as needed for bone density.  Baseline: not performing.  Goal status: INITIAL  Patient to score 19% or less on M-Oswestry in order to reach MCID. Baseline: 32% Goal status: INITIAL  Patient to report 40% improvement in pain levels.  Baseline: - Goal status: INITIAL    ASSESSMENT:  CLINICAL IMPRESSION:  Patient is a 58 y/o F with B peroneal neuropathy presenting to OPPT with c/o chronic B lower leg and LBP. Patient recalls good response to PT in the past. Patient notes life stress and nature of her job as a hydrologist where she is required to stand and squat for long periods as current barriers. Patient today presenting with TTP in posterior chain musculature, limited hip mobility, hip and knee weakness. Would benefit from skilled PT services 1-2 x/week for 6 weeks to address aforementioned impairments in order to optimize level of function.    OBJECTIVE IMPAIRMENTS: decreased activity tolerance, decreased mobility, decreased ROM, decreased strength,  impaired flexibility, postural dysfunction, and pain.   ACTIVITY LIMITATIONS: carrying, lifting, bending, sitting, standing, squatting, sleeping, stairs, transfers, and hygiene/grooming  PARTICIPATION LIMITATIONS: meal prep, cleaning, laundry, driving, shopping, community activity, occupation, yard work, and church  PERSONAL FACTORS: Age, Past/current experiences, Time since onset of injury/illness/exacerbation, and 3+ comorbidities: Anxiety, B peroneal neuropathy, depression, osteoporosis are also affecting patient's functional outcome.   REHAB POTENTIAL: Good  CLINICAL DECISION MAKING: Evolving/moderate complexity  EVALUATION COMPLEXITY: Moderate  PLAN:  PT FREQUENCY: 1-2x/week  PT DURATION: 6 weeks  PLANNED INTERVENTIONS: 97164- PT Re-evaluation, 97110-Therapeutic exercises, 97530- Therapeutic activity, 97112- Neuromuscular re-education, 97535- Self Care, 02859- Manual therapy, [phone]- Gait training, [phone]- Canalith repositioning, [id number]- Aquatic Therapy, [phone]- Electrical stimulation (manual), [phone] (1-2 muscles), [phone] (3+ muscles)- Dry Needling, Patient/Family education, Balance training, Stair training, Taping, Joint mobilization, Spinal mobilization, Vestibular training, and Moist heat  PLAN FOR NEXT SESSION: check lumbar AROM and join mobility; palpate lower legs; initiate HEP with hip, knee, core strengthening and work on [employer] strength training in safe manner to address bone density, may try nerve flossing   Louana Terrilyn Christians, PT, DPT 11/18/23 11:28 AM  [facility] at [facility] [address] [place], [place] [address] Phone # [phone] Fax # [phone]

## 2023-11-18 ENCOUNTER — Ambulatory Visit: Payer: PRIVATE HEALTH INSURANCE | Attending: Neurology | Admitting: Physical Therapy

## 2023-11-18 ENCOUNTER — Other Ambulatory Visit: Payer: Self-pay

## 2023-11-18 ENCOUNTER — Encounter: Payer: Self-pay | Admitting: Radiology

## 2023-11-18 DIAGNOSIS — G5730 Lesion of lateral popliteal nerve, unspecified lower limb: Secondary | ICD-10-CM | POA: Insufficient documentation

## 2023-11-18 DIAGNOSIS — G8929 Other chronic pain: Secondary | ICD-10-CM | POA: Diagnosis not present

## 2023-11-18 DIAGNOSIS — M5459 Other low back pain: Secondary | ICD-10-CM | POA: Insufficient documentation

## 2023-11-18 DIAGNOSIS — R29818 Other symptoms and signs involving the nervous system: Secondary | ICD-10-CM | POA: Insufficient documentation

## 2023-11-18 DIAGNOSIS — M5441 Lumbago with sciatica, right side: Secondary | ICD-10-CM | POA: Diagnosis not present

## 2023-11-18 DIAGNOSIS — M6281 Muscle weakness (generalized): Secondary | ICD-10-CM | POA: Diagnosis present

## 2023-11-18 DIAGNOSIS — M5442 Lumbago with sciatica, left side: Secondary | ICD-10-CM | POA: Diagnosis not present

## 2023-11-20 ENCOUNTER — Ambulatory Visit: Payer: PRIVATE HEALTH INSURANCE | Admitting: Physical Therapy

## 2023-11-20 ENCOUNTER — Encounter: Payer: Self-pay | Admitting: Physical Therapy

## 2023-11-20 DIAGNOSIS — R29818 Other symptoms and signs involving the nervous system: Secondary | ICD-10-CM

## 2023-11-20 DIAGNOSIS — M5459 Other low back pain: Secondary | ICD-10-CM

## 2023-11-20 DIAGNOSIS — M6281 Muscle weakness (generalized): Secondary | ICD-10-CM

## 2023-11-20 NOTE — Therapy (Signed)
 OUTPATIENT PHYSICAL THERAPY NEURO TREATMENT NOTE   Patient Name: Sheena Gray MRN: [id number] DOB:30-Jul-1965, 58 y.o., female Today's Date: 11/20/2023   PCP: Hughie Sharper, MD  REFERRING PROVIDER: Ines Onetha NOVAK, MD  END OF SESSION:  PT End of Session - 11/20/23 1018     Visit Number 2    Number of Visits 13    Date for Recertification  01/06/24    Authorization Type [employer]    Authorization - Visit Number 2    Authorization - Number of Visits 20   combined OT and PT   PT Start Time 1019    PT Stop Time 1100    PT Time Calculation (min) 41 min    Activity Tolerance Patient tolerated treatment well    Behavior During Therapy [facility] for tasks assessed/performed           Past Medical History:  Diagnosis Date   Anxiety    Chronic pain    Common peroneal nerve dysfunction, initial encounter 01/29/2019   Depression    Gallstones    Kidney stones    Osteopenia    Osteoporosis    Peroneal mononeuropathy    Past Surgical History:  Procedure Laterality Date   CHOLECYSTECTOMY     LITHOTRIPSY     SHOULDER ARTHROSCOPY  2015   Patient Active Problem List   Diagnosis Date Noted   Neuropathy of right peroneal nerve 09/09/2023   Neuropathy of left peroneal nerve 09/09/2023   Condition not found 09/09/2023   Abnormal perimenopausal bleeding 03/06/2019   Multiple neurological symptoms 12/01/2018   Chronic back pain 11/26/2017   Lumbar pain 10/16/2017   Microscopic hematuria 11/04/2015    ONSET DATE: 27 years   REFERRING DIAG: G57.30 (ICD-10-CM) - Peroneal neuropathy, unspecified laterality M54.42,M54.41,G89.29 (ICD-10-CM) - Chronic bilateral low back pain with bilateral sciatica  THERAPY DIAG:  Other symptoms and signs involving the nervous system  Other low back pain  Muscle weakness (generalized)  Rationale for Evaluation and Treatment: Rehabilitation  SUBJECTIVE:                                                                                                                                                                                              SUBJECTIVE STATEMENT: Pain is a numbness and burning.  Mostly at my low back and sometimes into L butt, but not today.  Have had a lot of exercises. Pt accompanied by: self  PERTINENT HISTORY: Anxiety, B peroneal neuropathy, depression, osteoporosis  PAIN:  Are you having pain? Yes: NPRS scale: varies-burning/tingling 4/10 Pain location: anterolateral tibia, bilat low back Pain description: tight, burning Aggravating factors:  stress Relieving factors: PT in the past  PRECAUTIONS: Other: osteoporosis  RED FLAGS: None   WEIGHT BEARING RESTRICTIONS: No  FALLS: Has patient fallen in last 6 months? 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  PATIENT GOALS: improve pain  OBJECTIVE:    TODAY'S TREATMENT: 11/20/2023 Activity Comments  Supine piriformis stretch Demo from previous exercise-good form, good stretch  Supine hooklying trunk rotation Ant/post pelvic tilts No increase in pain  Supine nerve flossing ankle dorsiflexion/plantarflexion and eversion also performed in sitting Feels good to ant tib  Quadruped core stability exercises:  alt UE lifts, alt leg lifts, bird dog, all performed 3 reps Cues for abdominal activation and steady trunk  Standing L stretch, 2 reps 10-15 sec  Good stretch  Self massage to ant tib bilat with tennis ball Tightness and tenderness to palpation, trigger points noted Feels good relief with massage   Access Code: [email or web address] URL: [email or web address] Date: 11/20/2023 Prepared by: [facility] - [facility]  Program Notes Massage to anterior tib muscles R and L with tennis ball, water ball (frozen) or foam roller, 3 minutes  1-2x/day  Exercises - Supine Peroneal Nerve Glide  - 1 x daily - 7 x weekly - 1-2 sets - 10 reps - Bird Dog  - 1 x daily - 7 x weekly - 2 sets - 5 reps - Long Sitting Ankle Pumps  - 2-3 x daily - 7 x weekly - 1-2 sets - 10 reps  PATIENT EDUCATION: Education details: HEP for gentle stretching, core stability; discussed stretch breaks through the day and to avoid over stretching.  Discussed core stability and massage to bilat ant tibialis muscles as ways to lessen overall pain.  Asked about body scan/mindfulness and she is doing these already.  Discussed posture/positioning in work paramedic at [employer] Person educated: Patient Education method: Programmer, Multimedia, Facilities Manager, and Handouts Education comprehension: verbalized understanding, returned demonstration, and needs further education  -------------------------------------- Note: Objective measures were completed at Evaluation unless otherwise noted.  DIAGNOSTIC FINDINGS: 08/30/23 R/L knee xray: No acute fracture or dislocation. 2. Serpiginous sclerotic focus in the distal left femoral metaphysis appears intramedullary measuring 2.5 cm in length. This is favored to represent a benign fibro-osseous lesion such as a bone infarct or enchondroma.  07/15/23 NCS: Nerve conductions show moderately-severe bilateral peroneal mononeuropathies. Cannot localize based on the nerve conductions or EMG needle exam. No evidence of lumbar radiculopathy, polyneuropathy or muscle disorder. This is consistent with emg/ncs performed 01/01/2019.    COGNITION: Overall cognitive status: Within functional limits for tasks assessed   SENSATION: Pt reports intact sensation to light touch in B feet  POSTURE: No Significant postural limitations  PALPATION: TTP in B PSIS, QL, R>L piriformis and proximal glutes   Flexibility: moderate tightness in fig 4 position; normal flexibility in KTOS    LUMBAR ROM:   Active  AROM  eval  Flexion   Extension   Right  lateral flexion   Left lateral flexion   Right rotation   Left rotation    (Blank rows = not tested)   LOWER EXTREMITY MMT:     Active  Right Eval Left Eval  Hip flexion 4- 4-  Hip extension 3+ 4  Hip abduction 4- 4-  Hip adduction 4+ 4  Hip internal rotation    Hip external rotation  Knee flexion 4- 4-  Knee extension 5 5  Ankle dorsiflexion 4+ 4+  Ankle plantarflexion 20 reps (more challenging) 20 reps   Ankle inversion 4+ 4+  Ankle eversion 4 4   (Blank rows = not tested)    GAIT: Findings: Assistive device utilized:None, Level of assistance: Complete Independence, and Comments: WFL step through pattern   PATIENT SURVEYS:  Modified Oswestry: 16/50 (32%)                                                                                                                              TREATMENT DATE: 11/18/23    PATIENT EDUCATION: Education details: prognosis, POC, edu on exam findings and how they relate to participation impairments, answered pt's questions on possible link of stress with pain  Person educated: Patient Education method: Explanation Education comprehension: verbalized understanding  HOME EXERCISE PROGRAM: Not yet initiated    GOALS: Goals reviewed with patient? Yes  SHORT TERM GOALS: Target date: 12/09/2023  Patient to be independent with initial HEP. Baseline: HEP initiated Goal status: INITIAL    LONG TERM GOALS: Target date: 01/06/24  Patient to be independent with advanced HEP. Baseline: Not yet initiated  Goal status: INITIAL  Patient to demonstrate B LE strength >/=4+/5.  Baseline: See above Goal status: INITIAL  Patient to report 3 ways to manage pain levels at home. Baseline: not initiaited Goal status: INITIAL  Patient to get connected with a counselor to help manage stress levels which affect pain.  Baseline: not initiated Goal status: INITIAL  Patient to consistently perform strength training 1-2x/week with  modifications as needed for bone density.  Baseline: not performing.  Goal status: INITIAL  Patient to score 19% or less on M-Oswestry in order to reach MCID. Baseline: 32% Goal status: INITIAL  Patient to report 40% improvement in pain levels.  Baseline: - Goal status: INITIAL    ASSESSMENT:  CLINICAL IMPRESSION: Pt presents today and reports bilateral low back pain, but no shooting pain into buttocks; she does have burning pain and numbness in bilat ant tib and bottom of feet area.  Worked on initiating HEP; pt already has quite a few of these exercises, but educated pt in several to start back on and to perform more of gentle stretch positions/avoid over stretching.  She does get good relief from nerve flossing and from [facility] using tennis ball to ant tibialis muscles bilaterally.  She will benefit from further skilled PT towards goals for improved functional mobility and decreased pain.  OBJECTIVE IMPAIRMENTS: decreased activity tolerance, decreased mobility, decreased ROM, decreased strength, impaired flexibility, postural dysfunction, and pain.   ACTIVITY LIMITATIONS: carrying, lifting, bending, sitting, standing, squatting, sleeping, stairs, transfers, and hygiene/grooming  PARTICIPATION LIMITATIONS: meal prep, cleaning, laundry, driving, shopping, community activity, occupation, yard work, and church  PERSONAL FACTORS: Age, Past/current experiences, Time since onset of injury/illness/exacerbation, and 3+ comorbidities: Anxiety, B peroneal neuropathy, depression, osteoporosis are also affecting patient's functional outcome.   REHAB  POTENTIAL: Good  CLINICAL DECISION MAKING: Evolving/moderate complexity  EVALUATION COMPLEXITY: Moderate  PLAN:  PT FREQUENCY: 1-2x/week  PT DURATION: 6 weeks  PLANNED INTERVENTIONS: 97164- PT Re-evaluation, 97110-Therapeutic exercises, 97530- Therapeutic activity, 97112- Neuromuscular re-education, 97535- Self Care, 02859- Manual therapy, [phone]-  Gait training, [phone]- Canalith repositioning, [id number]- Aquatic Therapy, [phone]- Electrical stimulation (manual), [phone] (1-2 muscles), [phone] (3+ muscles)- Dry Needling, Patient/Family education, Balance training, Stair training, Taping, Joint mobilization, Spinal mobilization, Vestibular training, and Moist heat  PLAN FOR NEXT SESSION: check lumbar AROM and join mobility; review and progress HEP with hip, knee, core strengthening and work on [employer] strength training in safe manner to address bone density, give L-stretch standing as picture to add to [employer], PT 11/20/23 12:54 PM Phone: [phone] Fax: [phone]  [facility] at [facility] [address], [address] [place], [place] [address] Phone # [phone] Fax # [phone]

## 2023-11-26 ENCOUNTER — Ambulatory Visit: Payer: PRIVATE HEALTH INSURANCE

## 2023-11-26 DIAGNOSIS — R29818 Other symptoms and signs involving the nervous system: Secondary | ICD-10-CM

## 2023-11-26 DIAGNOSIS — M6281 Muscle weakness (generalized): Secondary | ICD-10-CM

## 2023-11-26 DIAGNOSIS — M5459 Other low back pain: Secondary | ICD-10-CM

## 2023-11-26 NOTE — Therapy (Signed)
 OUTPATIENT PHYSICAL THERAPY NEURO TREATMENT NOTE   Patient Name: Sheena Gray MRN: [id number] DOB:23-Sep-1965, 58 y.o., female Today's Date: 11/26/2023   PCP: Hughie Sharper, MD  REFERRING PROVIDER: Ines Onetha NOVAK, MD  END OF SESSION:  PT End of Session - 11/26/23 1612     Visit Number 3    Number of Visits 13    Date for Recertification  01/06/24    Authorization Type [employer]    Authorization - Visit Number 3    Authorization - Number of Visits 20   combined OT and PT   PT Start Time 1615    PT Stop Time 1700    PT Time Calculation (min) 45 min    Activity Tolerance Patient tolerated treatment well    Behavior During Therapy [facility] for tasks assessed/performed           Past Medical History:  Diagnosis Date   Anxiety    Chronic pain    Common peroneal nerve dysfunction, initial encounter 01/29/2019   Depression    Gallstones    Kidney stones    Osteopenia    Osteoporosis    Peroneal mononeuropathy    Past Surgical History:  Procedure Laterality Date   CHOLECYSTECTOMY     LITHOTRIPSY     SHOULDER ARTHROSCOPY  2015   Patient Active Problem List   Diagnosis Date Noted   Neuropathy of right peroneal nerve 09/09/2023   Neuropathy of left peroneal nerve 09/09/2023   Condition not found 09/09/2023   Abnormal perimenopausal bleeding 03/06/2019   Multiple neurological symptoms 12/01/2018   Chronic back pain 11/26/2017   Lumbar pain 10/16/2017   Microscopic hematuria 11/04/2015    ONSET DATE: 27 years   REFERRING DIAG: G57.30 (ICD-10-CM) - Peroneal neuropathy, unspecified laterality M54.42,M54.41,G89.29 (ICD-10-CM) - Chronic bilateral low back pain with bilateral sciatica  THERAPY DIAG:  Other symptoms and signs involving the nervous system  Other low back pain  Muscle weakness (generalized)  Rationale for Evaluation and Treatment: Rehabilitation  SUBJECTIVE:                                                                                                                                                                                              SUBJECTIVE STATEMENT:  Pt accompanied by: self  PERTINENT HISTORY: Anxiety, B peroneal neuropathy, depression, osteoporosis  PAIN:  Are you having pain? Yes: NPRS scale: varies-burning/tingling 4/10 Pain location: anterolateral tibia, bilat low back Pain description: tight, burning Aggravating factors: stress Relieving factors: PT in the past  PRECAUTIONS: Other: osteoporosis  RED FLAGS: None   WEIGHT BEARING RESTRICTIONS: No  FALLS: Has patient fallen  in last 6 months? 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 Sensory applications: brushing, textured cloth, etc  HEP review Good return demonstration         TODAY'S TREATMENT: 11/20/2023 Activity Comments  Supine piriformis stretch Demo from previous exercise-good form, good stretch  Supine hooklying trunk rotation Ant/post pelvic tilts No increase in pain  Supine nerve flossing ankle dorsiflexion/plantarflexion and eversion also performed in sitting Feels good to ant tib  Quadruped core stability exercises:  alt UE lifts, alt leg lifts, bird dog, all performed 3 reps Cues for abdominal activation and steady trunk  Standing L  stretch, 2 reps 10-15 sec  Good stretch  Self massage to ant tib bilat with tennis ball Tightness and tenderness to palpation, trigger points noted Feels good relief with massage   Access Code: [email or web address] URL: [email or web address] Date: 11/20/2023 Prepared by: [facility] - Outpatient  Rehab - [facility]  Program Notes Massage to anterior tib muscles R and L with tennis ball, water ball (frozen) or foam roller, 3 minutes 1-2x/day  Exercises - Supine Peroneal Nerve Glide  - 1 x daily - 7 x weekly - 1-2 sets - 10 reps - Bird Dog  - 1 x daily - 7 x weekly - 2 sets - 5 reps - Long Sitting Ankle Pumps  - 2-3 x daily - 7 x weekly - 1-2 sets - 10 reps  PATIENT EDUCATION: Education details: HEP for gentle stretching, core stability; discussed stretch breaks through the day and to avoid over stretching.  Discussed core stability and massage to bilat ant tibialis muscles as ways to lessen overall pain.  Asked about body scan/mindfulness and she is doing these already.  Discussed posture/positioning in work paramedic at [employer] Person educated: Patient Education method: Programmer, Multimedia, Facilities Manager, and Handouts Education comprehension: verbalized understanding, returned demonstration, and needs further education  -------------------------------------- Note: Objective measures were completed at Evaluation unless otherwise noted.  DIAGNOSTIC FINDINGS: 08/30/23 R/L knee xray: No acute fracture or dislocation. 2. Serpiginous sclerotic focus in the distal left femoral metaphysis appears intramedullary measuring 2.5 cm in length. This is favored to represent a benign fibro-osseous lesion such as a bone infarct or enchondroma.  07/15/23 NCS: Nerve conductions show moderately-severe bilateral peroneal mononeuropathies. Cannot localize based on the nerve conductions or EMG needle exam. No evidence of lumbar radiculopathy, polyneuropathy or muscle disorder. This is consistent with emg/ncs  performed 01/01/2019.    COGNITION: Overall cognitive status: Within functional limits for tasks assessed   SENSATION: Pt reports intact sensation to light touch in B feet  POSTURE: No Significant postural limitations  PALPATION: TTP in B PSIS, QL, R>L piriformis and proximal glutes   Flexibility: moderate tightness in fig 4 position; normal flexibility in KTOS    LUMBAR ROM:   Active  AROM  eval  Flexion   Extension   Right lateral flexion   Left  lateral flexion   Right rotation   Left rotation    (Blank rows = not tested)   LOWER EXTREMITY MMT:     Active  Right Eval Left Eval  Hip flexion 4- 4-  Hip extension 3+ 4  Hip abduction 4- 4-  Hip adduction 4+ 4  Hip internal rotation    Hip external rotation    Knee flexion 4- 4-  Knee extension 5 5  Ankle dorsiflexion 4+ 4+  Ankle plantarflexion 20 reps (more challenging) 20 reps   Ankle inversion 4+ 4+  Ankle eversion 4 4   (Blank rows = not tested)    GAIT: Findings: Assistive device utilized:None, Level of assistance: Complete Independence, and Comments: WFL step through pattern   PATIENT SURVEYS:  Modified Oswestry: 16/50 (32%)                                                                                                                              TREATMENT DATE: 11/18/23    PATIENT EDUCATION: Education details: prognosis, POC, edu on exam findings and how they relate to participation impairments, answered pt's questions on possible link of stress with pain  Person educated: Patient Education method: Explanation Education comprehension: verbalized understanding  HOME EXERCISE PROGRAM: Not yet initiated    GOALS: Goals reviewed with patient? Yes  SHORT TERM GOALS: Target date: 12/09/2023  Patient to be independent with initial HEP. Baseline: HEP initiated Goal status: INITIAL    LONG TERM GOALS: Target date: 01/06/24  Patient to be independent with advanced HEP. Baseline: Not yet  initiated  Goal status: INITIAL  Patient to demonstrate B LE strength >/=4+/5.  Baseline: See above Goal status: INITIAL  Patient to report 3 ways to manage pain levels at home. Baseline: not initiaited Goal status: INITIAL  Patient to get connected with a counselor to help manage stress levels which affect pain.  Baseline: not initiated Goal status: INITIAL  Patient to consistently perform strength training 1-2x/week with modifications as needed for bone density.  Baseline: not performing.  Goal status: INITIAL  Patient to score 19% or less on M-Oswestry in order to reach MCID. Baseline: 32% Goal status: INITIAL  Patient to report 40% improvement in pain levels.  Baseline: - Goal status: INITIAL    ASSESSMENT:  CLINICAL IMPRESSION: Pt reports intermittent shooting pain to left buttocks. Discussed flexion-extension movement preference and to see if one movement helps to centralize vs peripheralize. Training in body mechanics principles and applications to reduce degree of knee flexion for deep squat (heels elevated/lift). Pt with questions regarding exercise for osteoporosis and discussed/demo tenets of compound/weight bearing exercise and initiating gentle plyometrics. Recommend continued sessions to address deficits and limitations.   OBJECTIVE IMPAIRMENTS: decreased activity tolerance, decreased mobility, decreased ROM, decreased strength, impaired flexibility, postural dysfunction, and pain.   ACTIVITY LIMITATIONS: carrying, lifting, bending, sitting, standing, squatting, sleeping, stairs, transfers, and hygiene/grooming  PARTICIPATION LIMITATIONS: meal prep, cleaning, laundry, driving, shopping, community activity, occupation,  yard work, and church  PERSONAL FACTORS: Age, Past/current experiences, Time since onset of injury/illness/exacerbation, and 3+ comorbidities: Anxiety, B peroneal neuropathy, depression, osteoporosis are also affecting patient's functional outcome.    REHAB POTENTIAL: Good  CLINICAL DECISION MAKING: Evolving/moderate complexity  EVALUATION COMPLEXITY: Moderate  PLAN:  PT FREQUENCY: 1-2x/week  PT DURATION: 6 weeks  PLANNED INTERVENTIONS: 97164- PT Re-evaluation, 97110-Therapeutic exercises, 97530- Therapeutic activity, [id number]- Neuromuscular re-education, 97535- Self Care, 02859- Manual therapy, [id number]- Gait training, [phone]- Canalith repositioning, [id number]- Aquatic Therapy, [phone]- Electrical stimulation (manual), [phone] (1-2 muscles), [phone] (3+ muscles)- Dry Needling, Patient/Family education, Balance training, Stair training, Taping, Joint mobilization, Spinal mobilization, Vestibular training, and Moist heat  PLAN FOR NEXT SESSION: check lumbar AROM and join mobility; review and progress HEP with hip, knee, core strengthening and work on [employer] strength training in safe manner to address bone density, give L-stretch standing as picture to add to HEP    5:09 PM, 11/26/23 M. Kelly Kamilya Wakeman, PT, DPT Physical Therapist- [place] Office Number: [phone]

## 2023-11-27 NOTE — Therapy (Signed)
 OUTPATIENT PHYSICAL THERAPY NEURO TREATMENT NOTE   Patient Name: Sheena Gray MRN: [id number] DOB:[birth year], 58 y.o., female Today's Date: 11/28/2023   PCP: Hughie Sharper, MD  REFERRING PROVIDER: Ines Onetha NOVAK, MD  END OF SESSION:  PT End of Session - 11/28/23 1657     Visit Number 4    Number of Visits 13    Date for Recertification  01/06/24    Authorization Type [employer]    Authorization - Visit Number 4    Authorization - Number of Visits 20   combined OT and PT   PT Start Time 1618    PT Stop Time 1656    PT Time Calculation (min) 38 min    Activity Tolerance Patient tolerated treatment well    Behavior During Therapy [facility] for tasks assessed/performed            Past Medical History:  Diagnosis Date   Anxiety    Chronic pain    Common peroneal nerve dysfunction, initial encounter 01/29/2019   Depression    Gallstones    Kidney stones    Osteopenia    Osteoporosis    Peroneal mononeuropathy    Past Surgical History:  Procedure Laterality Date   CHOLECYSTECTOMY     LITHOTRIPSY     SHOULDER ARTHROSCOPY  2015   Patient Active Problem List   Diagnosis Date Noted   Neuropathy of right peroneal nerve 09/09/2023   Neuropathy of left peroneal nerve 09/09/2023   Condition not found 09/09/2023   Abnormal perimenopausal bleeding 03/06/2019   Multiple neurological symptoms 12/01/2018   Chronic back pain 11/26/2017   Lumbar pain 10/16/2017   Microscopic hematuria 11/04/2015    ONSET DATE: 27 years   REFERRING DIAG: G57.30 (ICD-10-CM) - Peroneal neuropathy, unspecified laterality M54.42,M54.41,G89.29 (ICD-10-CM) - Chronic bilateral low back pain with bilateral sciatica  THERAPY DIAG:  Other symptoms and signs involving the nervous system  Other low back pain  Muscle weakness (generalized)  Rationale for Evaluation and Treatment: Rehabilitation  SUBJECTIVE:                                                                                                                                                                                              SUBJECTIVE STATEMENT: Just the same- burning, tingling in the LEs and pain in the back. HEP is not aggravating anything. Pt with questions on what she can do to improve bone density.    Pt accompanied by: self  PERTINENT HISTORY: Anxiety, B peroneal neuropathy, depression, osteoporosis  PAIN:  Are you having pain? Yes: NPRS scale: 3/10 Pain location: anterolateral tibia, bilat low back  Pain description: tight, burning Aggravating factors: stress Relieving factors: PT in the past  PRECAUTIONS: Other: osteoporosis  RED FLAGS: None   WEIGHT BEARING RESTRICTIONS: No  FALLS: Has patient fallen in last 6 months? 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 Provided modifications on making exercises more challenging once pt becomes comfortable with the basic movement. Good form with lunges but marked ankle instability of posterior LE  Standing quad stretch Cues for form for max stretch   Sitting KTOS 30              HOME EXERCISE PROGRAM Last updated: 11/28/23 Access Code: [email or web address] URL: [email or web address] Date: 11/28/2023 Prepared by: [facility] - Outpatient  Rehab - [facility]  Program Notes Massage to anterior tib muscles R and L with tennis ball, water ball (frozen) or foam roller, 3 minutes 1-2x/day  Exercises - Supine Peroneal Nerve Glide  - 1 x daily -  7 x weekly - 1-2 sets - 10 reps - Bird Dog  - 1 x daily - 7 x weekly - 2 sets - 5 reps - Long Sitting Ankle Pumps  - 2-3 x daily - 7 x weekly - 1-2 sets - 10 reps - Barbell Front Squat  - 1 x daily - 2-3 x weekly - 3 sets - 10 reps - Standing Balance Activity: Plyometric Squat to Jump  - 2-3 x weekly - 3 sets - 10 reps - Split Squats Upright Trunk (Quad Bias)  - 1 x daily - 5 x weekly - 2 sets - 10 reps - Forward Monster Walks  - 1 x daily - 5 x weekly - 2 sets - 1 min hold - Side Stepping with Resistance at Feet  - 1 x daily - 5 x weekly - 2 sets - 1 min hold - Standard Plank  - 1 x daily - 5 x weekly - 3 sets - 25 sec hold   PATIENT EDUCATION: Education details: HEP update for LE strengthening- advised to monitor sx response to assess need for modifications; edu on recommended frequency of strength training, modes of W[place] cardio Person educated: Patient Education method: Explanation, Demonstration, Tactile cues, Verbal cues, and Handouts Education comprehension: verbalized understanding and returned demonstration   -------------------------------------- Note: Objective measures were completed at Evaluation unless otherwise noted.  DIAGNOSTIC FINDINGS: 08/30/23 R/L knee xray: No acute fracture or dislocation. 2. Serpiginous sclerotic focus in the distal left femoral metaphysis appears intramedullary measuring 2.5 cm in length. This is favored to represent a benign fibro-osseous lesion such as a bone infarct or enchondroma.  07/15/23 NCS: Nerve conductions show moderately-severe bilateral peroneal mononeuropathies. Cannot localize based on the nerve conductions or EMG needle exam. No evidence of lumbar radiculopathy, polyneuropathy or muscle disorder. This is consistent with emg/ncs performed 01/01/2019.    COGNITION: Overall cognitive status: Within functional limits for tasks assessed   SENSATION: Pt reports intact sensation to light touch in B feet  POSTURE: No Significant  postural limitations  PALPATION: TTP in B PSIS, QL, R>L piriformis and proximal glutes   Flexibility: moderate tightness in fig  4 position; normal flexibility in KTOS    LUMBAR ROM:   Active  AROM  eval  Flexion   Extension   Right lateral flexion   Left lateral flexion   Right rotation   Left rotation    (Blank rows = not tested)   LOWER EXTREMITY MMT:     Active  Right Eval Left Eval  Hip flexion 4- 4-  Hip extension 3+ 4  Hip abduction 4- 4-  Hip adduction 4+ 4  Hip internal rotation    Hip external rotation    Knee flexion 4- 4-  Knee extension 5 5  Ankle dorsiflexion 4+ 4+  Ankle plantarflexion 20 reps (more challenging) 20 reps   Ankle inversion 4+ 4+  Ankle eversion 4 4   (Blank rows = not tested)    GAIT: Findings: Assistive device utilized:None, Level of assistance: Complete Independence, and Comments: WFL step through pattern   PATIENT SURVEYS:  Modified Oswestry: 16/50 (32%)                                                                                                                              TREATMENT DATE: 11/18/23    PATIENT EDUCATION: Education details: prognosis, POC, edu on exam findings and how they relate to participation impairments, answered pt's questions on possible link of stress with pain  Person educated: Patient Education method: Explanation Education comprehension: verbalized understanding  HOME EXERCISE PROGRAM: Not yet initiated    GOALS: Goals reviewed with patient? Yes  SHORT TERM GOALS: Target date: 12/09/2023  Patient to be independent with initial HEP. Baseline: HEP initiated Goal status: IN PROGRESS    LONG TERM GOALS: Target date: 01/06/24  Patient to be independent with advanced HEP. Baseline: Not yet initiated  Goal status: IN PROGRESS  Patient to demonstrate B LE strength >/=4+/5.  Baseline: See above Goal status: IN PROGRESS  Patient to report 3 ways to manage pain levels at  home. Baseline: not initiaited Goal status: IN PROGRESS  Patient to get connected with a counselor to help manage stress levels which affect pain.  Baseline: not initiated; pt reports that she had a counselor but cannot do PT and counseling at the same time so is focusing on PT for the time being 11/28/23 Goal status: IN PROGRESS  Patient to consistently perform strength training 1-2x/week with modifications as needed for bone density.  Baseline: not performing.  Goal status: IN PROGRESS  Patient to score 19% or less on M-Oswestry in order to reach MCID. Baseline: 32% Goal status: IN PROGRESS  Patient to report 40% improvement in pain levels.  Baseline: - Goal status: IN PROGRESS    ASSESSMENT:  CLINICAL IMPRESSION: Patient arrived to session without new complaints. Session focused on WBing LE strengthening with multiple options to self-progress. Patient responded well to cues for form and denied pain with all tasks. Did demo some minor imbalance and ankle instability with squats and monster walks. Patient tolerated session  well and without complaints at end of appointment.  OBJECTIVE IMPAIRMENTS: decreased activity tolerance, decreased mobility, decreased ROM, decreased strength, impaired flexibility, postural dysfunction, and pain.   ACTIVITY LIMITATIONS: carrying, lifting, bending, sitting, standing, squatting, sleeping, stairs, transfers, and hygiene/grooming  PARTICIPATION LIMITATIONS: meal prep, cleaning, laundry, driving, shopping, community activity, occupation, yard work, and church  PERSONAL FACTORS: Age, Past/current experiences, Time since onset of injury/illness/exacerbation, and 3+ comorbidities: Anxiety, B peroneal neuropathy, depression, osteoporosis are also affecting patient's functional outcome.   REHAB POTENTIAL: Good  CLINICAL DECISION MAKING: Evolving/moderate complexity  EVALUATION COMPLEXITY: Moderate  PLAN:  PT FREQUENCY: 1-2x/week  PT DURATION: 6  weeks  PLANNED INTERVENTIONS: 97164- PT Re-evaluation, 97110-Therapeutic exercises, 97530- Therapeutic activity, 97112- Neuromuscular re-education, 97535- Self Care, 02859- Manual therapy, [phone]- Gait training, [phone]- Canalith repositioning, [id number]- Aquatic Therapy, [phone]- Electrical stimulation (manual), [phone] (1-2 muscles), 20561 (3+ muscles)- Dry Needling, Patient/Family education, Balance training, Stair training, Taping, Joint mobilization, Spinal mobilization, Vestibular training, and Moist heat  PLAN FOR NEXT SESSION: check lumbar AROM and join mobility; review and progress HEP with hip, knee, give L-stretch standing as picture to add to HEP    Louana Terrilyn Christians, PT, DPT 11/28/23 5:00 PM  [facility] at [facility] [address], [address] [place], [place] [address] Phone # [phone] Fax # [phone]

## 2023-11-28 ENCOUNTER — Ambulatory Visit: Payer: PRIVATE HEALTH INSURANCE | Admitting: Physical Therapy

## 2023-11-28 ENCOUNTER — Encounter: Payer: Self-pay | Admitting: Physical Therapy

## 2023-11-28 DIAGNOSIS — M6281 Muscle weakness (generalized): Secondary | ICD-10-CM

## 2023-11-28 DIAGNOSIS — R29818 Other symptoms and signs involving the nervous system: Secondary | ICD-10-CM | POA: Diagnosis not present

## 2023-11-28 DIAGNOSIS — M5459 Other low back pain: Secondary | ICD-10-CM

## 2023-11-29 NOTE — Therapy (Incomplete)
 OUTPATIENT PHYSICAL THERAPY NEURO TREATMENT NOTE   Patient Name: Sheena Gray MRN: [id number] DOB:1965-10-01, 58 y.o., female Today's Date: 11/29/2023   PCP: Hughie Sharper, MD  REFERRING PROVIDER: Ines Onetha NOVAK, MD  END OF SESSION:      Past Medical History:  Diagnosis Date   Anxiety    Chronic pain    Common peroneal nerve dysfunction, initial encounter 01/29/2019   Depression    Gallstones    Kidney stones    Osteopenia    Osteoporosis    Peroneal mononeuropathy    Past Surgical History:  Procedure Laterality Date   CHOLECYSTECTOMY     LITHOTRIPSY     SHOULDER ARTHROSCOPY  2015   Patient Active Problem List   Diagnosis Date Noted   Neuropathy of right peroneal nerve 09/09/2023   Neuropathy of left peroneal nerve 09/09/2023   Condition not found 09/09/2023   Abnormal perimenopausal bleeding 03/06/2019   Multiple neurological symptoms 12/01/2018   Chronic back pain 11/26/2017   Lumbar pain 10/16/2017   Microscopic hematuria 11/04/2015    ONSET DATE: 27 years   REFERRING DIAG: G57.30 (ICD-10-CM) - Peroneal neuropathy, unspecified laterality M54.42,M54.41,G89.29 (ICD-10-CM) - Chronic bilateral low back pain with bilateral sciatica  THERAPY DIAG:  No diagnosis found.  Rationale for Evaluation and Treatment: Rehabilitation  SUBJECTIVE:                                                                                                                                                                                             SUBJECTIVE STATEMENT: Just the same- burning, tingling in the LEs and pain in the back. HEP is not aggravating anything. Pt with questions on what she can do to improve bone density.    Pt accompanied by: self  PERTINENT HISTORY: Anxiety, B peroneal neuropathy, depression, osteoporosis  PAIN:  Are you having pain? Yes: NPRS scale: 3/10 Pain location: anterolateral tibia, bilat low back Pain description: tight,  burning Aggravating factors: stress Relieving factors: PT in the past  PRECAUTIONS: Other: osteoporosis  RED FLAGS: None   WEIGHT BEARING RESTRICTIONS: No  FALLS: Has patient fallen in last 6 months? 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 Provided modifications on making exercises more challenging once pt becomes comfortable with the basic movement. Good form with lunges but marked ankle instability of posterior LE  Standing quad stretch Cues for form for max stretch   Sitting KTOS 30              HOME EXERCISE PROGRAM Last updated: 11/28/23 Access Code: [email or web address] URL: [email or web address] Date: 11/28/2023 Prepared by: [facility] - Outpatient  Rehab - [facility]  Program Notes Massage to anterior tib muscles R and L with tennis ball, water ball (frozen) or foam roller, 3 minutes 1-2x/day  Exercises - Supine Peroneal Nerve Glide  - 1 x daily - 7 x weekly - 1-2 sets - 10 reps - Bird Dog  - 1 x daily - 7 x weekly - 2 sets - 5 reps - Long Sitting Ankle Pumps  - 2-3 x daily - 7 x weekly - 1-2 sets - 10 reps - Barbell Front Squat  - 1 x daily - 2-3 x weekly - 3 sets - 10 reps - Standing Balance Activity: Plyometric Squat to Jump  - 2-3 x weekly - 3 sets - 10 reps - Split Squats Upright Trunk (Quad Bias)  - 1 x daily - 5 x weekly - 2 sets - 10 reps - Forward Monster Walks  - 1 x  daily - 5 x weekly - 2 sets - 1 min hold - Side Stepping with Resistance at Feet  - 1 x daily - 5 x weekly - 2 sets - 1 min hold - Standard Plank  - 1 x daily - 5 x weekly - 3 sets - 25 sec hold   PATIENT EDUCATION: Education details: HEP update for LE strengthening- advised to monitor sx response to assess need for modifications; edu on recommended frequency of strength training, modes of Wbing cardio Person educated: Patient Education method: Explanation, Demonstration, Tactile cues, Verbal cues, and Handouts Education comprehension: verbalized understanding and returned demonstration   -------------------------------------- Note: Objective measures were completed at Evaluation unless otherwise noted.  DIAGNOSTIC FINDINGS: 08/30/23 R/L knee xray: No acute fracture or dislocation. 2. Serpiginous sclerotic focus in the distal left femoral metaphysis appears intramedullary measuring 2.5 cm in length. This is favored to represent a benign fibro-osseous lesion such as a bone infarct or enchondroma.  07/15/23 NCS: Nerve conductions show moderately-severe bilateral peroneal mononeuropathies. Cannot localize based on the nerve conductions or EMG needle exam. No evidence of lumbar radiculopathy, polyneuropathy or muscle disorder. This is consistent with emg/ncs performed 01/01/2019.    COGNITION: Overall cognitive status: Within functional limits for tasks assessed   SENSATION: Pt reports intact sensation to light touch in B feet  POSTURE: No Significant postural limitations  PALPATION: TTP in B PSIS, QL, R>L piriformis and proximal glutes   Flexibility: moderate tightness in fig 4 position; normal flexibility in KTOS    LUMBAR ROM:   Active  AROM  eval  Flexion   Extension   Right lateral flexion   Left lateral flexion   Right rotation   Left rotation    (Blank rows = not tested)   LOWER EXTREMITY MMT:     Active  Right Eval Left Eval  Hip flexion 4- 4-  Hip extension  3+ 4  Hip abduction 4- 4-  Hip adduction 4+ 4  Hip internal rotation    Hip  external rotation    Knee flexion 4- 4-  Knee extension 5 5  Ankle dorsiflexion 4+ 4+  Ankle plantarflexion 20 reps (more challenging) 20 reps   Ankle inversion 4+ 4+  Ankle eversion 4 4   (Blank rows = not tested)    GAIT: Findings: Assistive device utilized:None, Level of assistance: Complete Independence, and Comments: WFL step through pattern   PATIENT SURVEYS:  Modified Oswestry: 16/50 (32%)                                                                                                                              TREATMENT DATE: 11/18/23    PATIENT EDUCATION: Education details: prognosis, POC, edu on exam findings and how they relate to participation impairments, answered pt's questions on possible link of stress with pain  Person educated: Patient Education method: Explanation Education comprehension: verbalized understanding  HOME EXERCISE PROGRAM: Not yet initiated    GOALS: Goals reviewed with patient? Yes  SHORT TERM GOALS: Target date: 12/09/2023  Patient to be independent with initial HEP. Baseline: HEP initiated Goal status: IN PROGRESS    LONG TERM GOALS: Target date: 01/06/24  Patient to be independent with advanced HEP. Baseline: Not yet initiated  Goal status: IN PROGRESS  Patient to demonstrate B LE strength >/=4+/5.  Baseline: See above Goal status: IN PROGRESS  Patient to report 3 ways to manage pain levels at home. Baseline: not initiaited Goal status: IN PROGRESS  Patient to get connected with a counselor to help manage stress levels which affect pain.  Baseline: not initiated; pt reports that she had a counselor but cannot do PT and counseling at the same time so is focusing on PT for the time being 11/28/23 Goal status: IN PROGRESS  Patient to consistently perform strength training 1-2x/week with modifications as needed for bone density.  Baseline: not  performing.  Goal status: IN PROGRESS  Patient to score 19% or less on M-Oswestry in order to reach MCID. Baseline: 32% Goal status: IN PROGRESS  Patient to report 40% improvement in pain levels.  Baseline: - Goal status: IN PROGRESS    ASSESSMENT:  CLINICAL IMPRESSION: Patient arrived to session without new complaints. Session focused on WBing LE strengthening with multiple options to self-progress. Patient responded well to cues for form and denied pain with all tasks. Did demo some minor imbalance and ankle instability with squats and monster walks. Patient tolerated session well and without complaints at end of appointment.  OBJECTIVE IMPAIRMENTS: decreased activity tolerance, decreased mobility, decreased ROM, decreased strength, impaired flexibility, postural dysfunction, and pain.   ACTIVITY LIMITATIONS: carrying, lifting, bending, sitting, standing, squatting, sleeping, stairs, transfers, and hygiene/grooming  PARTICIPATION LIMITATIONS: meal prep, cleaning, laundry, driving, shopping, community activity, occupation, yard work, and church  PERSONAL FACTORS: Age, Past/current experiences, Time since onset of injury/illness/exacerbation, and 3+ comorbidities: Anxiety, B peroneal neuropathy, depression, osteoporosis are also affecting patient's functional outcome.   REHAB POTENTIAL: Good  CLINICAL DECISION MAKING:  Evolving/moderate complexity  EVALUATION COMPLEXITY: Moderate  PLAN:  PT FREQUENCY: 1-2x/week  PT DURATION: 6 weeks  PLANNED INTERVENTIONS: 97164- PT Re-evaluation, 97110-Therapeutic exercises, 97530- Therapeutic activity, 97112- Neuromuscular re-education, 97535- Self Care, 02859- Manual therapy, [phone]- Gait training, [phone]- Canalith repositioning, [id number]- Aquatic Therapy, [phone]- Electrical stimulation (manual), [phone] (1-2 muscles), 20561 (3+ muscles)- Dry Needling, Patient/Family education, Balance training, Stair training, Taping, Joint mobilization, Spinal  mobilization, Vestibular training, and Moist heat  PLAN FOR NEXT SESSION: check lumbar AROM and join mobility; review and progress HEP with hip, knee, give L-stretch standing as picture to add to HEP    Louana Terrilyn Christians, PT, DPT 11/29/23 11:47 AM  [facility] at [facility] [address], [address] [place], [place] [address] Phone # [phone] Fax # [phone]

## 2023-12-02 ENCOUNTER — Ambulatory Visit: Payer: PRIVATE HEALTH INSURANCE | Admitting: Physical Therapy

## 2023-12-05 ENCOUNTER — Ambulatory Visit: Payer: PRIVATE HEALTH INSURANCE

## 2023-12-05 DIAGNOSIS — R29818 Other symptoms and signs involving the nervous system: Secondary | ICD-10-CM | POA: Diagnosis not present

## 2023-12-05 DIAGNOSIS — M5459 Other low back pain: Secondary | ICD-10-CM

## 2023-12-05 DIAGNOSIS — M6281 Muscle weakness (generalized): Secondary | ICD-10-CM

## 2023-12-05 NOTE — Therapy (Signed)
 OUTPATIENT PHYSICAL THERAPY NEURO TREATMENT NOTE   Patient Name: Sheena Gray MRN: [id number] DOB:1965/11/19, 58 y.o., female Today's Date: 12/05/2023   PCP: Hughie Sharper, MD  REFERRING PROVIDER: Ines Onetha NOVAK, MD  END OF SESSION:  PT End of Session - 12/05/23 1605     Visit Number 5    Number of Visits 13    Date for Recertification  01/06/24    Authorization Type [employer]    Authorization - Number of Visits 20   combined OT and PT   PT Start Time 1615    PT Stop Time 1700    PT Time Calculation (min) 45 min    Activity Tolerance Patient tolerated treatment well    Behavior During Therapy [facility] for tasks assessed/performed            Past Medical History:  Diagnosis Date   Anxiety    Chronic pain    Common peroneal nerve dysfunction, initial encounter 01/29/2019   Depression    Gallstones    Kidney stones    Osteopenia    Osteoporosis    Peroneal mononeuropathy    Past Surgical History:  Procedure Laterality Date   CHOLECYSTECTOMY     LITHOTRIPSY     SHOULDER ARTHROSCOPY  2015   Patient Active Problem List   Diagnosis Date Noted   Neuropathy of right peroneal nerve 09/09/2023   Neuropathy of left peroneal nerve 09/09/2023   Condition not found 09/09/2023   Abnormal perimenopausal bleeding 03/06/2019   Multiple neurological symptoms 12/01/2018   Chronic back pain 11/26/2017   Lumbar pain 10/16/2017   Microscopic hematuria 11/04/2015    ONSET DATE: 27 years   REFERRING DIAG: G57.30 (ICD-10-CM) - Peroneal neuropathy, unspecified laterality M54.42,M54.41,G89.29 (ICD-10-CM) - Chronic bilateral low back pain with bilateral sciatica  THERAPY DIAG:  Other symptoms and signs involving the nervous system  Other low back pain  Muscle weakness (generalized)  Rationale for Evaluation and Treatment: Rehabilitation  SUBJECTIVE:                                                                                                                                                                                              SUBJECTIVE STATEMENT: Had some increased discomfort in the BLE following last session, possibly from the resisted activities. Have been exercising with the rebounder trampoline and vibration platform   Pt accompanied by: self  PERTINENT HISTORY: Anxiety, B peroneal neuropathy, depression, osteoporosis  PAIN:  Are you having pain? Yes: NPRS scale: 3/10 Pain location: anterolateral tibia, bilat low back Pain description: tight, burning Aggravating factors: stress Relieving factors: PT in the past  PRECAUTIONS: Other: osteoporosis  RED FLAGS: None   WEIGHT BEARING RESTRICTIONS: No  FALLS: Has patient fallen in last 6 months? 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 Discussed using her home TENs units with different programs/applications for pain mgmt goals.  Pt education Programming/formating for comprehensive fitness program with emphasis on addressing osteoporosis         TODAY'S TREATMENT: 11/28/23 Activity Comments  WBing strengthening: goblet squat, then with 15# kettlebell Split squats  walking lunges sidestepping with red TB loop monster walk with red TB loop elbows/knees planks Mirror feedback, cueing for proper form. Provided modifications on making exercises more challenging once pt becomes comfortable with the basic movement. Good form with lunges but marked ankle instability of posterior LE  Standing quad stretch Cues for  form for max stretch   Sitting KTOS 30              HOME EXERCISE PROGRAM Last updated: 11/28/23 Access Code: [email or web address] URL: [email or web address] Date: 11/28/2023 Prepared by: [facility] - Outpatient  Rehab - [facility]  Program Notes Massage to anterior tib muscles R and L with tennis ball, water ball (frozen) or foam roller, 3 minutes 1-2x/day  Exercises - Supine Peroneal Nerve Glide  - 1 x daily - 7 x weekly - 1-2 sets - 10 reps - Bird Dog  - 1 x daily - 7 x weekly - 2 sets - 5 reps - Long Sitting Ankle Pumps  - 2-3 x daily - 7 x weekly - 1-2 sets - 10 reps - Barbell Front Squat  - 1 x daily - 2-3 x weekly - 3 sets - 10 reps - Standing Balance Activity: Plyometric Squat to Jump  - 2-3 x weekly - 3 sets - 10 reps - Split Squats Upright Trunk (Quad Bias)  - 1 x daily - 5 x weekly - 2 sets - 10 reps - Forward Monster Walks  - 1 x daily - 5 x weekly - 2 sets - 1 min hold - Side Stepping with Resistance at Feet  - 1 x daily - 5 x weekly - 2 sets - 1 min hold - Standard Plank  - 1 x daily - 5 x weekly - 3 sets - 25 sec hold   PATIENT EDUCATION: Education details: HEP update for LE strengthening- advised to monitor sx response to assess need for modifications; edu on recommended frequency of strength training, modes of Wbing cardio Person educated: Patient Education method: Explanation, Demonstration, Tactile cues, Verbal cues, and Handouts Education comprehension: verbalized understanding and returned demonstration   -------------------------------------- Note: Objective measures were completed at Evaluation unless otherwise noted.  DIAGNOSTIC FINDINGS: 08/30/23 R/L knee xray: No acute fracture or dislocation. 2. Serpiginous sclerotic focus in the distal left femoral metaphysis appears intramedullary measuring 2.5 cm in length. This is favored to represent a benign fibro-osseous lesion such as a bone infarct or enchondroma.  07/15/23 NCS: Nerve conductions  show moderately-severe bilateral peroneal mononeuropathies. Cannot localize based on the nerve conductions or EMG needle exam. No evidence of lumbar radiculopathy, polyneuropathy or muscle disorder.  This is consistent with emg/ncs performed 01/01/2019.    COGNITION: Overall cognitive status: Within functional limits for tasks assessed   SENSATION: Pt reports intact sensation to light touch in B feet  POSTURE: No Significant postural limitations  PALPATION: TTP in B PSIS, QL, R>L piriformis and proximal glutes   Flexibility: moderate tightness in fig 4 position; normal flexibility in KTOS    LUMBAR ROM:   Active  AROM  eval  Flexion   Extension   Right lateral flexion   Left lateral flexion   Right rotation   Left rotation    (Blank rows = not tested)   LOWER EXTREMITY MMT:     Active  Right Eval Left Eval  Hip flexion 4- 4-  Hip extension 3+ 4  Hip abduction 4- 4-  Hip adduction 4+ 4  Hip internal rotation    Hip external rotation    Knee flexion 4- 4-  Knee extension 5 5  Ankle dorsiflexion 4+ 4+  Ankle plantarflexion 20 reps (more challenging) 20 reps   Ankle inversion 4+ 4+  Ankle eversion 4 4   (Blank rows = not tested)    GAIT: Findings: Assistive device utilized:None, Level of assistance: Complete Independence, and Comments: WFL step through pattern   PATIENT SURVEYS:  Modified Oswestry: 16/50 (32%)                                                                                                                              TREATMENT DATE: 11/18/23    PATIENT EDUCATION: Education details: prognosis, POC, edu on exam findings and how they relate to participation impairments, answered pt's questions on possible link of stress with pain  Person educated: Patient Education method: Explanation Education comprehension: verbalized understanding  HOME EXERCISE PROGRAM: Not yet initiated    GOALS: Goals reviewed with patient? Yes  SHORT TERM GOALS:  Target date: 12/09/2023  Patient to be independent with initial HEP. Baseline: HEP initiated Goal status: IN PROGRESS    LONG TERM GOALS: Target date: 01/06/24  Patient to be independent with advanced HEP. Baseline: Not yet initiated  Goal status: IN PROGRESS  Patient to demonstrate B LE strength >/=4+/5.  Baseline: See above Goal status: IN PROGRESS  Patient to report 3 ways to manage pain levels at home. Baseline: not initiaited Goal status: IN PROGRESS  Patient to get connected with a counselor to help manage stress levels which affect pain.  Baseline: not initiated; pt reports that she had a counselor but cannot do PT and counseling at the same time so is focusing on PT for the time being 11/28/23 Goal status: IN PROGRESS  Patient to consistently perform strength training 1-2x/week with modifications as needed for bone density.  Baseline: not performing.  Goal status: IN PROGRESS  Patient to score 19% or less on M-Oswestry in order to reach MCID. Baseline: 32% Goal status: IN PROGRESS  Patient to report 40% improvement in pain levels.  Baseline: -  Goal status: IN PROGRESS    ASSESSMENT:  CLINICAL IMPRESSION: Discussed PT modalities and manual interventions and more so identified limitations of our modalities to address her issues.  Discussed alternative TENS unit applications to trial for pain control. Instructed in additional compound resistance movements to improve strength as well as benefit to bone density and then we discussed programming/sequencing for fully rounded, comprehensive exercise program. Pt requests a couple of weeks to perform activities at home and return to clinic for re-assessment  OBJECTIVE IMPAIRMENTS: decreased activity tolerance, decreased mobility, decreased ROM, decreased strength, impaired flexibility, postural dysfunction, and pain.   ACTIVITY LIMITATIONS: carrying, lifting, bending, sitting, standing, squatting, sleeping, stairs,  transfers, and hygiene/grooming  PARTICIPATION LIMITATIONS: meal prep, cleaning, laundry, driving, shopping, community activity, occupation, yard work, and church  PERSONAL FACTORS: Age, Past/current experiences, Time since onset of injury/illness/exacerbation, and 3+ comorbidities: Anxiety, B peroneal neuropathy, depression, osteoporosis are also affecting patient's functional outcome.   REHAB POTENTIAL: Good  CLINICAL DECISION MAKING: Evolving/moderate complexity  EVALUATION COMPLEXITY: Moderate  PLAN:  PT FREQUENCY: 1-2x/week  PT DURATION: 6 weeks  PLANNED INTERVENTIONS: 97164- PT Re-evaluation, 97110-Therapeutic exercises, 97530- Therapeutic activity, [id number]- Neuromuscular re-education, 97535- Self Care, 02859- Manual therapy, [id number]- Gait training, [phone]- Canalith repositioning, [id number]- Aquatic Therapy, [phone]- Electrical stimulation (manual), [phone] (1-2 muscles), 20561 (3+ muscles)- Dry Needling, Patient/Family education, Balance training, Stair training, Taping, Joint mobilization, Spinal mobilization, Vestibular training, and Moist heat  PLAN FOR NEXT SESSION: check lumbar AROM and join mobility; review and progress HEP with hip, knee, give L-stretch standing as picture to add to HEP    5:06 PM, 12/05/23 M. Kelly Danilyn Cocke, PT, DPT Physical Therapist- [place] Office Number: [phone]

## 2023-12-09 ENCOUNTER — Ambulatory Visit: Payer: PRIVATE HEALTH INSURANCE | Admitting: Physical Therapy

## 2023-12-11 ENCOUNTER — Ambulatory Visit: Payer: PRIVATE HEALTH INSURANCE | Admitting: Physical Therapy

## 2023-12-16 ENCOUNTER — Ambulatory Visit: Payer: PRIVATE HEALTH INSURANCE | Admitting: Physical Therapy

## 2023-12-19 ENCOUNTER — Ambulatory Visit: Payer: PRIVATE HEALTH INSURANCE

## 2023-12-20 NOTE — Therapy (Incomplete)
 OUTPATIENT PHYSICAL THERAPY NEURO TREATMENT NOTE   Patient Name: Sheena Gray MRN: [id number] DOB:[birth year], 58 y.o., female Today's Date: 12/20/2023   PCP: Hughie Sharper, MD  REFERRING PROVIDER: Ines Onetha NOVAK, MD  END OF SESSION:      Past Medical History:  Diagnosis Date   Anxiety    Chronic pain    Common peroneal nerve dysfunction, initial encounter 01/29/2019   Depression    Gallstones    Kidney stones    Osteopenia    Osteoporosis    Peroneal mononeuropathy    Past Surgical History:  Procedure Laterality Date   CHOLECYSTECTOMY     LITHOTRIPSY     SHOULDER ARTHROSCOPY  2015   Patient Active Problem List   Diagnosis Date Noted   Neuropathy of right peroneal nerve 09/09/2023   Neuropathy of left peroneal nerve 09/09/2023   Condition not found 09/09/2023   Abnormal perimenopausal bleeding 03/06/2019   Multiple neurological symptoms 12/01/2018   Chronic back pain 11/26/2017   Lumbar pain 10/16/2017   Microscopic hematuria 11/04/2015    ONSET DATE: 27 years   REFERRING DIAG: G57.30 (ICD-10-CM) - Peroneal neuropathy, unspecified laterality M54.42,M54.41,G89.29 (ICD-10-CM) - Chronic bilateral low back pain with bilateral sciatica  THERAPY DIAG:  No diagnosis found.  Rationale for Evaluation and Treatment: Rehabilitation  SUBJECTIVE:                                                                                                                                                                                             SUBJECTIVE STATEMENT: Had some increased discomfort in the BLE following last session, possibly from the resisted activities. Have been exercising with the rebounder trampoline and vibration platform   Pt accompanied by: self  PERTINENT HISTORY: Anxiety, B peroneal neuropathy, depression, osteoporosis  PAIN:  Are you having pain? Yes: NPRS scale: 3/10 Pain location: anterolateral tibia, bilat low back Pain description: tight,  burning Aggravating factors: stress Relieving factors: PT in the past  PRECAUTIONS: Other: osteoporosis  RED FLAGS: None   WEIGHT BEARING RESTRICTIONS: No  FALLS: Has patient fallen in last 6 months? 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 Discussed using her home TENs units with different programs/applications for pain mgmt goals.  Pt education Programming/formating for comprehensive fitness program with emphasis on addressing osteoporosis         TODAY'S TREATMENT: 11/28/23 Activity Comments  WBing strengthening: goblet squat, then with 15# kettlebell Split squats  walking lunges sidestepping with red TB loop monster walk with red TB loop elbows/knees planks Mirror feedback, cueing for proper form. Provided modifications on making exercises more challenging once pt becomes comfortable with the basic movement. Good form with lunges but marked ankle instability of posterior LE  Standing quad stretch Cues for form for max stretch   Sitting KTOS 30              HOME EXERCISE PROGRAM Last updated: 11/28/23 Access Code: [email or web address] URL: [email or web address] Date: 11/28/2023 Prepared by: [facility] - Outpatient  Rehab - [facility]  Program Notes Massage to anterior tib muscles R and L with tennis ball, water ball (frozen) or foam roller, 3 minutes 1-2x/day  Exercises - Supine Peroneal Nerve  Glide  - 1 x daily - 7 x weekly - 1-2 sets - 10 reps - Bird Dog  - 1 x daily - 7 x weekly - 2 sets - 5 reps - Long Sitting Ankle Pumps  - 2-3 x daily - 7 x weekly - 1-2 sets - 10 reps - Barbell Front Squat  - 1 x daily - 2-3 x weekly - 3 sets - 10 reps - Standing Balance Activity: Plyometric Squat to Jump  - 2-3 x weekly - 3 sets - 10 reps - Split Squats Upright Trunk (Quad Bias)  - 1 x daily - 5 x weekly - 2 sets - 10 reps - Forward Monster Walks  - 1 x daily - 5 x weekly - 2 sets - 1 min hold - Side Stepping with Resistance at Feet  - 1 x daily - 5 x weekly - 2 sets - 1 min hold - Standard Plank  - 1 x daily - 5 x weekly - 3 sets - 25 sec hold   PATIENT EDUCATION: Education details: HEP update for LE strengthening- advised to monitor sx response to assess need for modifications; edu on recommended frequency of strength training, modes of Wbing cardio Person educated: Patient Education method: Explanation, Demonstration, Tactile cues, Verbal cues, and Handouts Education comprehension: verbalized understanding and returned demonstration   -------------------------------------- Note: Objective measures were completed at Evaluation unless otherwise noted.  DIAGNOSTIC FINDINGS: 08/30/23 R/L knee xray: No acute fracture or dislocation. 2. Serpiginous sclerotic focus in the distal left femoral metaphysis appears intramedullary measuring 2.5 cm in length. This is favored to represent a benign fibro-osseous lesion such as a bone infarct or enchondroma.  07/15/23 NCS: Nerve conductions show moderately-severe bilateral peroneal mononeuropathies. Cannot localize based on the nerve conductions or EMG needle exam. No evidence of lumbar radiculopathy, polyneuropathy or muscle disorder. This is consistent with emg/ncs performed 01/01/2019.    COGNITION: Overall cognitive status: Within functional limits for tasks assessed   SENSATION: Pt reports intact sensation to light touch in B feet  POSTURE:  No Significant postural limitations  PALPATION: TTP in B PSIS, QL, R>L piriformis and proximal glutes   Flexibility: moderate tightness in fig 4 position; normal flexibility in KTOS    LUMBAR ROM:   Active  AROM  eval  Flexion   Extension  Right lateral flexion   Left lateral flexion   Right rotation   Left rotation    (Blank rows = not tested)   LOWER EXTREMITY MMT:     Active  Right Eval Left Eval  Hip flexion 4- 4-  Hip extension 3+ 4  Hip abduction 4- 4-  Hip adduction 4+ 4  Hip internal rotation    Hip external rotation    Knee flexion 4- 4-  Knee extension 5 5  Ankle dorsiflexion 4+ 4+  Ankle plantarflexion 20 reps (more challenging) 20 reps   Ankle inversion 4+ 4+  Ankle eversion 4 4   (Blank rows = not tested)    GAIT: Findings: Assistive device utilized:None, Level of assistance: Complete Independence, and Comments: WFL step through pattern   PATIENT SURVEYS:  Modified Oswestry: 16/50 (32%)                                                                                                                              TREATMENT DATE: 11/18/23    PATIENT EDUCATION: Education details: prognosis, POC, edu on exam findings and how they relate to participation impairments, answered pt's questions on possible link of stress with pain  Person educated: Patient Education method: Explanation Education comprehension: verbalized understanding  HOME EXERCISE PROGRAM: Not yet initiated    GOALS: Goals reviewed with patient? Yes  SHORT TERM GOALS: Target date: 12/09/2023  Patient to be independent with initial HEP. Baseline: HEP initiated Goal status: IN PROGRESS    LONG TERM GOALS: Target date: 01/06/24  Patient to be independent with advanced HEP. Baseline: Not yet initiated  Goal status: IN PROGRESS  Patient to demonstrate B LE strength >/=4+/5.  Baseline: See above Goal status: IN PROGRESS  Patient to report 3 ways to manage pain levels at  home. Baseline: not initiaited Goal status: IN PROGRESS  Patient to get connected with a counselor to help manage stress levels which affect pain.  Baseline: not initiated; pt reports that she had a counselor but cannot do PT and counseling at the same time so is focusing on PT for the time being 11/28/23 Goal status: IN PROGRESS  Patient to consistently perform strength training 1-2x/week with modifications as needed for bone density.  Baseline: not performing.  Goal status: IN PROGRESS  Patient to score 19% or less on M-Oswestry in order to reach MCID. Baseline: 32% Goal status: IN PROGRESS  Patient to report 40% improvement in pain levels.  Baseline: - Goal status: IN PROGRESS    ASSESSMENT:  CLINICAL IMPRESSION: Discussed PT modalities and manual interventions and more so identified limitations of our modalities to address her issues.  Discussed alternative TENS unit applications to trial for pain control. Instructed in additional compound resistance movements to improve strength as well as benefit to bone density and then we discussed programming/sequencing for fully rounded, comprehensive exercise program. Pt requests a couple of weeks to perform activities at home and return to clinic for  re-assessment  OBJECTIVE IMPAIRMENTS: decreased activity tolerance, decreased mobility, decreased ROM, decreased strength, impaired flexibility, postural dysfunction, and pain.   ACTIVITY LIMITATIONS: carrying, lifting, bending, sitting, standing, squatting, sleeping, stairs, transfers, and hygiene/grooming  PARTICIPATION LIMITATIONS: meal prep, cleaning, laundry, driving, shopping, community activity, occupation, yard work, and church  PERSONAL FACTORS: Age, Past/current experiences, Time since onset of injury/illness/exacerbation, and 3+ comorbidities: Anxiety, B peroneal neuropathy, depression, osteoporosis are also affecting patient's functional outcome.   REHAB POTENTIAL:  Good  CLINICAL DECISION MAKING: Evolving/moderate complexity  EVALUATION COMPLEXITY: Moderate  PLAN:  PT FREQUENCY: 1-2x/week  PT DURATION: 6 weeks  PLANNED INTERVENTIONS: 97164- PT Re-evaluation, 97110-Therapeutic exercises, 97530- Therapeutic activity, [id number]- Neuromuscular re-education, 97535- Self Care, [id number]- Manual therapy, [id number]- Gait training, [phone]- Canalith repositioning, [id number]- Aquatic Therapy, [phone]- Electrical stimulation (manual), [phone] (1-2 muscles), [id number] (3+ muscles)- Dry Needling, Patient/Family education, Balance training, Stair training, Taping, Joint mobilization, Spinal mobilization, Vestibular training, and Moist heat  PLAN FOR NEXT SESSION: check lumbar AROM and join mobility; review and progress HEP with hip, knee, give L-stretch standing as picture to add to HEP    10:07 AM, 12/20/23 M. Kelly Halpin, PT, DPT Physical Therapist- [place] Office Number: [phone]

## 2023-12-23 ENCOUNTER — Ambulatory Visit: Payer: PRIVATE HEALTH INSURANCE | Admitting: Physical Therapy

## 2023-12-26 ENCOUNTER — Ambulatory Visit: Payer: PRIVATE HEALTH INSURANCE | Admitting: Physical Therapy

## 2023-12-30 ENCOUNTER — Ambulatory Visit: Payer: PRIVATE HEALTH INSURANCE | Admitting: Physical Therapy

## 2024-01-02 ENCOUNTER — Ambulatory Visit: Payer: PRIVATE HEALTH INSURANCE | Admitting: Physical Therapy
